# Patient Record
Sex: Male | Born: 1950 | Race: Black or African American | Hispanic: No | Smoking: Former smoker
Health system: Southern US, Community
[De-identification: ages and names within clinical notes are randomized; demographics above are authoritative.]

## PROBLEM LIST (undated history)

## (undated) DIAGNOSIS — J302 Other seasonal allergic rhinitis: Secondary | ICD-10-CM

## (undated) DIAGNOSIS — E785 Hyperlipidemia, unspecified: Secondary | ICD-10-CM

## (undated) DIAGNOSIS — F1910 Other psychoactive substance abuse, uncomplicated: Secondary | ICD-10-CM

## (undated) DIAGNOSIS — I10 Essential (primary) hypertension: Secondary | ICD-10-CM

## (undated) DIAGNOSIS — D649 Anemia, unspecified: Secondary | ICD-10-CM

## (undated) HISTORY — PX: CIRCUMCISION: SUR203

## (undated) HISTORY — PX: COLONOSCOPY: SHX174

## (undated) HISTORY — DX: Other seasonal allergic rhinitis: J30.2

## (undated) HISTORY — PX: POLYPECTOMY: SHX149

## (undated) HISTORY — PX: OTHER SURGICAL HISTORY: SHX169

## (undated) HISTORY — PX: WISDOM TOOTH EXTRACTION: SHX21

## (undated) HISTORY — DX: Essential (primary) hypertension: I10

## (undated) HISTORY — DX: Hyperlipidemia, unspecified: E78.5

## (undated) HISTORY — DX: Anemia, unspecified: D64.9

## (undated) HISTORY — DX: Other psychoactive substance abuse, uncomplicated: F19.10

---

## 2004-12-20 ENCOUNTER — Ambulatory Visit: Payer: Self-pay | Admitting: Internal Medicine

## 2015-06-15 ENCOUNTER — Encounter: Payer: Self-pay | Admitting: Family Medicine

## 2015-06-15 ENCOUNTER — Ambulatory Visit (INDEPENDENT_AMBULATORY_CARE_PROVIDER_SITE_OTHER): Payer: 59 | Admitting: Family Medicine

## 2015-06-15 ENCOUNTER — Encounter: Payer: Self-pay | Admitting: Internal Medicine

## 2015-06-15 VITALS — BP 140/100 | HR 83 | Temp 98.1°F | Ht 71.0 in | Wt 172.0 lb

## 2015-06-15 DIAGNOSIS — Z1211 Encounter for screening for malignant neoplasm of colon: Secondary | ICD-10-CM | POA: Diagnosis not present

## 2015-06-15 DIAGNOSIS — R030 Elevated blood-pressure reading, without diagnosis of hypertension: Secondary | ICD-10-CM

## 2015-06-15 DIAGNOSIS — Z87891 Personal history of nicotine dependence: Secondary | ICD-10-CM

## 2015-06-15 DIAGNOSIS — I517 Cardiomegaly: Secondary | ICD-10-CM

## 2015-06-15 DIAGNOSIS — E785 Hyperlipidemia, unspecified: Secondary | ICD-10-CM | POA: Insufficient documentation

## 2015-06-15 DIAGNOSIS — K4090 Unilateral inguinal hernia, without obstruction or gangrene, not specified as recurrent: Secondary | ICD-10-CM

## 2015-06-15 DIAGNOSIS — Z0000 Encounter for general adult medical examination without abnormal findings: Secondary | ICD-10-CM

## 2015-06-15 DIAGNOSIS — IMO0001 Reserved for inherently not codable concepts without codable children: Secondary | ICD-10-CM

## 2015-06-15 LAB — COMPREHENSIVE METABOLIC PANEL
ALT: 16 U/L (ref 0–53)
AST: 20 U/L (ref 0–37)
Albumin: 4 g/dL (ref 3.5–5.2)
Alkaline Phosphatase: 69 U/L (ref 39–117)
BUN: 12 mg/dL (ref 6–23)
CHLORIDE: 106 meq/L (ref 96–112)
CO2: 30 mEq/L (ref 19–32)
Calcium: 9.7 mg/dL (ref 8.4–10.5)
Creatinine, Ser: 0.89 mg/dL (ref 0.40–1.50)
GFR: 90.99 mL/min (ref >60.00)
GLUCOSE: 79 mg/dL (ref 70–99)
POTASSIUM: 4.6 meq/L (ref 3.5–5.1)
Sodium: 141 mEq/L (ref 135–145)
TOTAL PROTEIN: 6.9 g/dL (ref 6.0–8.3)
Total Bilirubin: 0.5 mg/dL (ref 0.2–1.2)

## 2015-06-15 LAB — LIPID PANEL
Cholesterol: 180 mg/dL (ref 0–200)
HDL: 37.3 mg/dL — AB (ref >39.00)
LDL CALC: 128 mg/dL — AB (ref 0–99)
NONHDL: 143.16
Total CHOL/HDL Ratio: 5
Triglycerides: 74 mg/dL (ref 0.0–149.0)
VLDL: 14.8 mg/dL (ref 0.0–40.0)

## 2015-06-15 LAB — POCT URINALYSIS DIPSTICK
BILIRUBIN UA: NEGATIVE
GLUCOSE UA: NEGATIVE
Ketones, UA: NEGATIVE
LEUKOCYTES UA: NEGATIVE
NITRITE UA: NEGATIVE
Protein, UA: NEGATIVE
RBC UA: NEGATIVE
Spec Grav, UA: 1.02
Urobilinogen, UA: 0.2
pH, UA: 6

## 2015-06-15 LAB — CBC
HEMATOCRIT: 38.8 % — AB (ref 39.0–52.0)
HEMOGLOBIN: 12.4 g/dL — AB (ref 13.0–17.0)
MCHC: 32 g/dL (ref 30.0–36.0)
MCV: 78.9 fl (ref 78.0–100.0)
Platelets: 306 10*3/uL (ref 150.0–400.0)
RBC: 4.92 Mil/uL (ref 4.22–5.81)
RDW: 14.2 % (ref 11.5–15.5)
WBC: 5.3 10*3/uL (ref 4.0–10.5)

## 2015-06-15 LAB — TSH: TSH: 0.68 u[IU]/mL (ref 0.35–4.50)

## 2015-06-15 NOTE — Patient Instructions (Addendum)
Make sure to get regular dental exams (every 6 months) and eye exams (every 1-2 years)  Your blood pressure concerns me. I would like for you to buy/use a home cuff to check at least daily. Your goal is <140/90. Since we are considering surgery for left hernia, we need to get this down. I would be ok seeing you at 8 am next Tuesday to check in to see how blood pressure is doing and if we need to consider medication. If you are thinking surgery at a later date, ok to see me in early January (unless BP regularly over 150/90). Bring your log and blood pressure cuff with you. Try Dash Eating plan listed below, cut soda, consider regular exercise even outside of work  We will call you within a week about your referral to GI for colonoscopy and surgery for left groin hernia. If you do not hear within 2 weeks, give [place] a call.   Your EKG is concerning for enlargement of left side of heart which could be blood pressure related. We are going to get an echocardiogram (ultrasound of heart) to assess further  You declined flu shot  Labs before you go.   DASH Eating Plan DASH stands for "Dietary Approaches to Stop Hypertension." The DASH eating plan is a healthy eating plan that has been shown to reduce high blood pressure (hypertension). Additional health benefits may include reducing the risk of type 2 diabetes mellitus, heart disease, and stroke. The DASH eating plan may also help with weight loss. WHAT DO I NEED TO KNOW ABOUT THE DASH EATING PLAN? For the DASH eating plan, you will follow these general guidelines:  Choose foods with a percent daily value for sodium of less than 5% (as listed on the food label).  Use salt-free seasonings or herbs instead of table salt or sea salt.  Check with your health care provider or pharmacist before using salt substitutes.  Eat lower-sodium products, often labeled as "lower sodium" or "no salt added."  Eat fresh foods.  Eat more vegetables, fruits, and low-fat  dairy products.  Choose whole grains. Look for the word "whole" as the first word in the ingredient list.  Choose fish and skinless chicken or [place] more often than red meat. Limit fish, poultry, and meat to 6 oz (170 g) each day.  Limit sweets, desserts, sugars, and sugary drinks.  Choose heart-healthy fats.  Limit cheese to 1 oz (28 g) per day.  Eat more home-cooked food and less restaurant, buffet, and fast food.  Limit fried foods.  Cook foods using methods other than frying.  Limit canned vegetables. If you do use them, rinse them well to decrease the sodium.  When eating at a restaurant, ask that your food be prepared with less salt, or no salt if possible. WHAT FOODS CAN I EAT? Seek help from a dietitian for individual calorie needs. Grains Whole grain or whole wheat bread. Brown rice. Whole grain or whole wheat pasta. Quinoa, bulgur, and whole grain cereals. Low-sodium cereals. Corn or whole wheat flour tortillas. Whole grain cornbread. Whole grain crackers. Low-sodium crackers. Vegetables Fresh or frozen vegetables (raw, steamed, roasted, or grilled). Low-sodium or reduced-sodium tomato and vegetable juices. Low-sodium or reduced-sodium tomato sauce and paste. Low-sodium or reduced-sodium canned vegetables.  Fruits All fresh, canned (in natural juice), or frozen fruits. Meat and Other Protein Products Ground beef (85% or leaner), grass-fed beef, or beef trimmed of fat. Skinless chicken or [place]. Ground chicken or [place]. Pork trimmed of  fat. All fish and seafood. Eggs. Dried beans, peas, or lentils. Unsalted nuts and seeds. Unsalted canned beans. Dairy Low-fat dairy products, such as skim or 1% milk, 2% or reduced-fat cheeses, low-fat ricotta or cottage cheese, or plain low-fat yogurt. Low-sodium or reduced-sodium cheeses. Fats and Oils Tub margarines without trans fats. Light or reduced-fat mayonnaise and salad dressings (reduced sodium). Avocado. Safflower, olive, or  canola oils. Natural peanut or almond butter. Other Unsalted popcorn and pretzels. The items listed above may not be a complete list of recommended foods or beverages. Contact your dietitian for more options. WHAT FOODS ARE NOT RECOMMENDED? Grains White bread. White pasta. White rice. Refined cornbread. Bagels and croissants. Crackers that contain trans fat. Vegetables Creamed or fried vegetables. Vegetables in a cheese sauce. Regular canned vegetables. Regular canned tomato sauce and paste. Regular tomato and vegetable juices. Fruits Dried fruits. Canned fruit in light or heavy syrup. Fruit juice. Meat and Other Protein Products Fatty cuts of meat. Ribs, chicken wings, bacon, sausage, bologna, salami, chitterlings, fatback, hot dogs, bratwurst, and packaged luncheon meats. Salted nuts and seeds. Canned beans with salt. Dairy Whole or 2% milk, cream, half-and-half, and cream cheese. Whole-fat or sweetened yogurt. Full-fat cheeses or blue cheese. Nondairy creamers and whipped toppings. Processed cheese, cheese spreads, or cheese curds. Condiments Onion and garlic salt, seasoned salt, table salt, and sea salt. Canned and packaged gravies. Worcestershire sauce. Tartar sauce. Barbecue sauce. Teriyaki sauce. Soy sauce, including reduced sodium. Steak sauce. Fish sauce. Oyster sauce. Cocktail sauce. Horseradish. Ketchup and mustard. Meat flavorings and tenderizers. Bouillon cubes. Hot sauce. Tabasco sauce. Marinades. Taco seasonings. Relishes. Fats and Oils Butter, stick margarine, lard, shortening, ghee, and bacon fat. Coconut, palm kernel, or palm oils. Regular salad dressings. Other Pickles and olives. Salted popcorn and pretzels. The items listed above may not be a complete list of foods and beverages to avoid. Contact your dietitian for more information. WHERE CAN I FIND MORE INFORMATION? National Heart, Lung, and Blood Institute: travelstabloid.com   This  information is not intended to replace advice given to you by your health care provider. Make sure you discuss any questions you have with your health care provider.   Document Released: 06/09/2011 Document Revised: 07/11/2014 Document Reviewed: 04/24/2013 Elsevier Interactive Patient Education [employer].
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
## 2015-06-15 NOTE — Addendum Note (Signed)
Addended by: [name] on: 06/15/2015 09:09 AM   Modules accepted: Orders

## 2015-06-22 ENCOUNTER — Encounter: Payer: Self-pay | Admitting: Family Medicine

## 2015-06-22 ENCOUNTER — Ambulatory Visit (INDEPENDENT_AMBULATORY_CARE_PROVIDER_SITE_OTHER): Payer: 59 | Admitting: Family Medicine

## 2015-06-22 VITALS — BP 160/80 | HR 82 | Temp 98.4°F | Wt 175.0 lb

## 2015-06-22 DIAGNOSIS — E785 Hyperlipidemia, unspecified: Secondary | ICD-10-CM

## 2015-06-22 DIAGNOSIS — I10 Essential (primary) hypertension: Secondary | ICD-10-CM | POA: Insufficient documentation

## 2015-06-22 MED ORDER — HYDROCHLOROTHIAZIDE 25 MG PO TABS: 25.0000 mg | ORAL_TABLET | Freq: Every day | ORAL | Status: DC

## 2015-06-22 NOTE — Assessment & Plan Note (Signed)
S: poorly controlled on no medicine.  Lab Results  Component Value Date   CHOL 180 06/15/2015   HDL 37.30* 06/15/2015   LDLCALC 128* 06/15/2015   TRIG 74.0 06/15/2015   CHOLHDL 5 06/15/2015   A/P: no rx. Calculate risk once BP controlled. Consider statin. Wife may be resistant to idea- perhaps perioperative statin?

## 2015-06-22 NOTE — Patient Instructions (Addendum)
Start Hydrochlorothiazide 25mg   We will follow up at next visit  I think we have time before colonoscopy and surgery to get blood pressure in a better place- so do not need to push back either at present  Health Maintenance Due  Topic Date Due  . Hepatitis C Screening  09/05/1950  . HIV Screening  10/17/1964  . TETANUS/TDAP  10/17/1968  . COLONOSCOPY  10/18/1999  . ZOSTAVAX  10/17/2009  . PNA vac Low Risk Adult (1 of 2 - PCV13) 10/18/2014

## 2015-06-22 NOTE — Progress Notes (Signed)
David Reddish, MD  Subjective:  David Gilmore is a 64 y.o. year old very pleasant male patient who presents for/with See problem oriented charting ROS- No chest pain or shortness of breath. No headache or blurry vision. Able to complete 4 mets without difficulty (stairs or walk up incline)  Past Medical History-  Patient Active Problem List   Diagnosis Date Noted  . Essential hypertension 06/22/2015    Priority: Medium  . Hyperlipidemia     Priority: Medium  . Former smoker 06/15/2015    Priority: Low    Medications- reviewed and updated Current Outpatient Prescriptions  Medication Sig Dispense Refill  . hydrochlorothiazide (HYDRODIURIL) 25 MG tablet Take 1 tablet (25 mg total) by mouth daily. 90 tablet 3   No current facility-administered medications for this visit.    Objective: BP 160/80 mmHg  Pulse 82  Temp(Src) 98.4 F (36.9 C)  Wt 175 lb (79.379 kg) Gen: NAD, resting comfortably CV: RRR no murmurs rubs or gallops Lungs: CTAB no crackles, wheeze, rhonchi Abdomen: soft/nontender/nondistended/normal bowel sounds. No rebound or guarding.  Ext: no edema Skin: warm, dry Neuro: grossly normal, moves all extremities  Assessment/Plan:  Essential hypertension S: controlled poorly without any medication. My repeat was 156/06. New diagnosis given 2 consecutive elevated SBP readings  BP Readings from Last 3 Encounters:  06/22/15 160/80  06/15/15 140/100  A/P: Start HCTZ. Follow up early January as scheduled.    Hyperlipidemia S: poorly controlled on no medicine.  Lab Results  Component Value Date   CHOL 180 06/15/2015   HDL 37.30* 06/15/2015   LDLCALC 128* 06/15/2015   TRIG 74.0 06/15/2015   CHOLHDL 5 06/15/2015   A/P: no rx. Calculate risk once BP controlled. Consider statin. Wife may be resistant to idea- perhaps perioperative statin?    January follow up return precautions advised.   Meds ordered this encounter  Medications  . hydrochlorothiazide  (HYDRODIURIL) 25 MG tablet    Sig: Take 1 tablet (25 mg total) by mouth daily.    Dispense:  90 tablet    Refill:  3

## 2015-06-22 NOTE — Assessment & Plan Note (Signed)
S: controlled poorly without any medication. My repeat was 156/06. New diagnosis given 2 consecutive elevated SBP readings  BP Readings from Last 3 Encounters:  06/22/15 160/80  06/15/15 140/100  A/P: Start HCTZ. Follow up early January as scheduled.

## 2015-06-23 ENCOUNTER — Ambulatory Visit (HOSPITAL_COMMUNITY): Payer: 59 | Attending: Family Medicine

## 2015-06-23 ENCOUNTER — Other Ambulatory Visit: Payer: Self-pay

## 2015-06-23 ENCOUNTER — Telehealth: Payer: Self-pay | Admitting: Family Medicine

## 2015-06-23 DIAGNOSIS — R931 Abnormal findings on diagnostic imaging of heart and coronary circulation: Secondary | ICD-10-CM

## 2015-06-23 DIAGNOSIS — I517 Cardiomegaly: Secondary | ICD-10-CM | POA: Insufficient documentation

## 2015-06-23 DIAGNOSIS — I10 Essential (primary) hypertension: Secondary | ICD-10-CM | POA: Insufficient documentation

## 2015-06-23 DIAGNOSIS — I5189 Other ill-defined heart diseases: Secondary | ICD-10-CM | POA: Diagnosis not present

## 2015-06-23 DIAGNOSIS — I071 Rheumatic tricuspid insufficiency: Secondary | ICD-10-CM | POA: Diagnosis not present

## 2015-06-23 DIAGNOSIS — Z87891 Personal history of nicotine dependence: Secondary | ICD-10-CM | POA: Diagnosis not present

## 2015-06-23 NOTE — Telephone Encounter (Signed)
Due to echo results, ordered stress test to be done before potential surgery and colonoscopy. See echo result note. Discussed with patient

## 2015-06-26 ENCOUNTER — Other Ambulatory Visit (HOSPITAL_COMMUNITY): Payer: 59

## 2015-06-26 ENCOUNTER — Ambulatory Visit (HOSPITAL_COMMUNITY)
Admission: RE | Admit: 2015-06-26 | Discharge: 2015-06-26 | Disposition: A | Discharge status: Home / Self Care | Payer: 59 | Source: Ambulatory Visit | Attending: Family Medicine | Admitting: Family Medicine

## 2015-06-26 DIAGNOSIS — I700 Atherosclerosis of aorta: Secondary | ICD-10-CM | POA: Insufficient documentation

## 2015-06-26 DIAGNOSIS — Z87891 Personal history of nicotine dependence: Secondary | ICD-10-CM | POA: Insufficient documentation

## 2015-06-26 DIAGNOSIS — E785 Hyperlipidemia, unspecified: Secondary | ICD-10-CM | POA: Insufficient documentation

## 2015-06-26 DIAGNOSIS — I10 Essential (primary) hypertension: Secondary | ICD-10-CM | POA: Diagnosis not present

## 2015-07-01 ENCOUNTER — Telehealth (HOSPITAL_COMMUNITY): Payer: Self-pay | Admitting: *Deleted

## 2015-07-01 ENCOUNTER — Encounter (HOSPITAL_COMMUNITY): Payer: 59

## 2015-07-01 NOTE — Telephone Encounter (Signed)
Left message on voicemail in reference to upcoming appointment scheduled for 07/07/15. Phone number given for a call back so details instructions can be given. Kaavya Puskarich, Ranae [place]

## 2015-07-07 ENCOUNTER — Encounter (HOSPITAL_COMMUNITY): Payer: 59

## 2015-07-13 ENCOUNTER — Ambulatory Visit: Payer: 59 | Admitting: Family Medicine

## 2015-07-16 ENCOUNTER — Telehealth (HOSPITAL_COMMUNITY): Payer: Self-pay

## 2015-07-16 ENCOUNTER — Telehealth (HOSPITAL_COMMUNITY): Payer: Self-pay | Admitting: *Deleted

## 2015-07-16 NOTE — Telephone Encounter (Signed)
Encounter complete. 

## 2015-07-16 NOTE — Telephone Encounter (Signed)
Patient given detailed instructions per Myocardial Perfusion Study Information Sheet for the test on 07/20/15 at 730. Patient notified to arrive 15 minutes early and that it is imperative to arrive on time for appointment to keep from having the test rescheduled.  If you need to cancel or reschedule your appointment, please call the office within 24 hours of your appointment. Failure to do so may result in a cancellation of your appointment, and a $50 no show fee. Patient verbalized understanding.Hubbard Robinson, RN

## 2015-07-20 ENCOUNTER — Ambulatory Visit (HOSPITAL_COMMUNITY): Payer: 59 | Attending: Cardiovascular Disease

## 2015-07-20 DIAGNOSIS — I10 Essential (primary) hypertension: Secondary | ICD-10-CM | POA: Insufficient documentation

## 2015-07-20 DIAGNOSIS — R42 Dizziness and giddiness: Secondary | ICD-10-CM | POA: Insufficient documentation

## 2015-07-20 DIAGNOSIS — R931 Abnormal findings on diagnostic imaging of heart and coronary circulation: Secondary | ICD-10-CM | POA: Diagnosis present

## 2015-07-20 LAB — MYOCARDIAL PERFUSION IMAGING
CHL CUP MPHR: 155 {beats}/min
CHL CUP RESTING HR STRESS: 82 {beats}/min
CHL RATE OF PERCEIVED EXERTION: 15
CSEPEDS: 5 s
CSEPEW: 8.5 METS
CSEPHR: 103 %
CSEPPHR: 160 {beats}/min
Exercise duration (min): 10 min
LHR: 0.27
LVDIAVOL: 135 mL
LVSYSVOL: 87 mL
SDS: 0
SRS: 4
SSS: 4
TID: 1.01

## 2015-07-20 MED ORDER — TECHNETIUM TC 99M SESTAMIBI GENERIC - CARDIOLITE
32.3000 | Freq: Once | INTRAVENOUS | Status: AC | PRN
  Administered 2015-07-20: 32.3 via INTRAVENOUS

## 2015-07-20 MED ORDER — TECHNETIUM TC 99M SESTAMIBI GENERIC - CARDIOLITE
10.7000 | Freq: Once | INTRAVENOUS | Status: AC | PRN
  Administered 2015-07-20: 11 via INTRAVENOUS

## 2015-07-27 ENCOUNTER — Ambulatory Visit (INDEPENDENT_AMBULATORY_CARE_PROVIDER_SITE_OTHER): Payer: 59 | Admitting: Family Medicine

## 2015-07-27 ENCOUNTER — Encounter: Payer: Self-pay | Admitting: Family Medicine

## 2015-07-27 ENCOUNTER — Telehealth: Payer: Self-pay | Admitting: Family Medicine

## 2015-07-27 VITALS — BP 132/84 | HR 93 | Temp 97.8°F | Wt 175.0 lb

## 2015-07-27 DIAGNOSIS — I10 Essential (primary) hypertension: Secondary | ICD-10-CM

## 2015-07-27 DIAGNOSIS — R9439 Abnormal result of other cardiovascular function study: Secondary | ICD-10-CM

## 2015-07-27 DIAGNOSIS — Z23 Encounter for immunization: Secondary | ICD-10-CM

## 2015-07-27 DIAGNOSIS — E785 Hyperlipidemia, unspecified: Secondary | ICD-10-CM | POA: Diagnosis not present

## 2015-07-27 NOTE — Assessment & Plan Note (Signed)
S: controlled after start of hctz 25mg  last visit  BP Readings from Last 3 Encounters:  07/27/15 132/84  06/22/15 160/80  06/15/15 140/100  A/P:Continue current medications treat to <140/90.

## 2015-07-27 NOTE — Assessment & Plan Note (Addendum)
S: Patient has been out of medical care for years. When I first evaluated him, blood pressure was 140/100 and a week later was 160/80. Initial EKG showed concern for ventricular hypertrophy. He likely had dealth with long term hypertension. EKG was done before patient proceeded for first colonoscopy or for hernia surgery. Due to LVH, completed an echocardiogram which showed  EF 45-50% and diffuse hypokinesis. Due to mildly depressed EF and diffuse hypokinesis, a myocardial perfusion scan was ordered and is noted in objective section with highlights being EF of 35% as well as some upsloping ST segment deperssions which quickly returned to baseline after minute of recovery and overall study report was "no evidence of ischemia".  A/P: I discussed with family today that with reduced EF on echo and myocardial perfusion stress test as well as some EKG changes during stress test, I would like to get cardiology's opinion if he can proceed forward with hernia surgery and colonoscopy. My primary question is given high risk patient (65 year old male with hypertension and untreated hyperlipidemia-not ready to start statin yet- as well as 30 pack year smoker who quit in 2000) should he have cardiac cath, other further studies, or can he safely proceed to surgery and colonoscopy.

## 2015-07-27 NOTE — Assessment & Plan Note (Addendum)
S: poorly controlled on no medicine. No myalgias.  Lab Results  Component Value Date   CHOL 180 06/15/2015   HDL 37.30* 06/15/2015   LDLCALC 128* 06/15/2015   TRIG 74.0 06/15/2015   CHOLHDL 5 06/15/2015   A/P: I advised statin with 18% 10 year risk of cva/mi. I would especially advise perioperatively for upcoming hernia surgery or colonoscopy. Patient declines at present- wants to discuss with cardiology. Did advise at a minimum aspirin 81mg .

## 2015-07-27 NOTE — Progress Notes (Signed)
David Reddish, MD  Subjective:  David Gilmore is a 65 y.o. year old very pleasant male patient who presents for/with See problem oriented charting ROS- No chest pain or shortness of breath including with activity. No headache or blurry vision.   Past Medical History-  Patient Active Problem List   Diagnosis Date Noted  . Essential hypertension 06/22/2015    Priority: Medium  . Hyperlipidemia     Priority: Medium  . Former smoker 06/15/2015    Priority: Low  . Abnormal stress test 07/27/2015    Medications- reviewed and updated Current Outpatient Prescriptions  Medication Sig Dispense Refill  . aspirin 81 MG tablet Take 81 mg by mouth daily.    . hydrochlorothiazide (HYDRODIURIL) 25 MG tablet Take 1 tablet (25 mg total) by mouth daily. 90 tablet 3   No current facility-administered medications for this visit.    Objective: BP 132/84 mmHg  Pulse 93  Temp(Src) 97.8 F (36.6 C)  Wt 175 lb (79.379 kg) Gen: NAD, resting comfortably CV: RRR no murmurs rubs or gallops Lungs: CTAB no crackles, wheeze, rhonchi Abdomen: soft/nontender/nondistended/normal bowel sounds. No rebound or guarding.  Ext: no edema Skin: warm, dry Neuro: grossly normal, moves all extremities  07/20/15 Study Highlights     Nuclear stress EF: 35%.  Blood pressure demonstrated a hypertensive response to exercise.  Upsloping ST segment depression ST segment depression of 3 mm was noted during stress in the II, III, aVF, V4, V5 and V6 leads, and returning to baseline after less than 1 minute of recovery.  The left ventricular ejection fraction is moderately decreased (30-44%).  The LV perfusion is good. No evidence of ischemia. Exercise tolerance is good. 10 minutes on Bruce Protocol. No chest pain. The LV systolic function is depressed at 35% with global hypokinesis. Consider echo to evaluate LV systolic function further.   Assessment/Plan:  Abnormal stress test S: Patient has been out  of medical care for years. When I first evaluated him, blood pressure was 140/100 and a week later was 160/80. Initial EKG showed concern for ventricular hypertrophy. He likely had dealth with long term hypertension. EKG was done before patient proceeded for first colonoscopy or for hernia surgery. Due to LVH, completed an echocardiogram which showed  EF 45-50% and diffuse hypokinesis. Due to mildly depressed EF and diffuse hypokinesis, a myocardial perfusion scan was ordered and is noted in objective section with highlights being EF of 35% as well as some upsloping ST segment deperssions which quickly returned to baseline after minute of recovery and overall study report was "no evidence of ischemia".  A/P: I discussed with family today that with reduced EF on echo and myocardial perfusion stress test as well as some EKG changes during stress test, I would like to get cardiology's opinion if he can proceed forward with hernia surgery and colonoscopy. My primary question is given high risk patient (65 year old male with hypertension and untreated hyperlipidemia-not ready to start statin yet- as well as 30 pack year smoker who quit in 2000) should he have cardiac cath, other further studies, or can he safely proceed to surgery and colonoscopy.    Essential hypertension S: controlled after start of hctz 25mg  last visit  BP Readings from Last 3 Encounters:  07/27/15 132/84  06/22/15 160/80  06/15/15 140/100  A/P:Continue current medications treat to <140/90.    Hyperlipidemia S: poorly controlled on no medicine. No myalgias.  Lab Results  Component Value Date   CHOL 180 06/15/2015  HDL 37.30* 06/15/2015   LDLCALC 128* 06/15/2015   TRIG 74.0 06/15/2015   CHOLHDL 5 06/15/2015   A/P: I advised statin with 18% 10 year risk of cva/mi. I would especially advise perioperatively for upcoming hernia surgery or colonoscopy. Patient declines at present- wants to discuss with cardiology. Did advise at a  minimum aspirin 81mg .      3 months verbal with sooner Return precautions advised.   Orders Placed This Encounter  Procedures  . Pneumococcal conjugate vaccine 13-valent  . Ambulatory referral to Cardiology    Referral Priority:  Routine    Referral Type:  Consultation    Referral Reason:  Specialty Services Required    Requested Specialty:  Cardiology    Number of Visits Requested:  1    Meds ordered this encounter  Medications  . aspirin 81 MG tablet    Sig: Take 81 mg by mouth daily.

## 2015-07-27 NOTE — Telephone Encounter (Signed)
Dr hunter Dr Einar Gip  Office stated in order for this pt to be seen they are requiring office notes to be faxed  for review . Once Dr Einar Gip review then he will make a determation if he can see this pt or not  I faxed over notes and I informed pt of this as well please advise

## 2015-07-27 NOTE — Telephone Encounter (Signed)
Can you please make sure they get my note from today? I just finished it. My primary question is with depressed EF in high risk patient and in light of some of the abnormal findings on stress test- does patient need cardiac cath before hernia surgery or colonoscopy?

## 2015-07-27 NOTE — Patient Instructions (Addendum)
May have to cancel colonoscopy   We will call you within a week about your referral to Dr. Einar Gip. If you do not hear within 2 weeks, give [place] a call.   Blood pressure looks excellent today and I am very pleased that is just with one medicine.   Start aspirin 81mg  daily (baby aspirin)  You wanted to get Dr. Irven Shelling opinion on cholesterol medicine. Your risk is nearly 18% 10 year for heart attack or stroke which is above 10% cut off I use to advise medicine. i would advise something like atorvastatin 20mg  daily or rosuvastatin 10mg  etc.

## 2015-08-14 ENCOUNTER — Encounter: Payer: Self-pay | Admitting: Internal Medicine

## 2015-08-25 ENCOUNTER — Encounter: Payer: 59 | Admitting: Internal Medicine

## 2015-09-02 HISTORY — PX: HERNIA REPAIR: SHX51

## 2015-09-25 ENCOUNTER — Telehealth: Payer: Self-pay

## 2015-09-25 NOTE — Telephone Encounter (Signed)
Pt has an EF of 35%. Advd pt will need to cancel appts and rsc at [facility]. Expln will cb with new appt date and time. Pt okay w plan.

## 2015-09-28 ENCOUNTER — Telehealth: Payer: Self-pay | Admitting: *Deleted

## 2015-09-28 NOTE — Telephone Encounter (Signed)
Patient had stress test 07/20/15, EF 35%. See TE from [place] on 07/27/15, due to EF 35% and abnormal findings, his question was does pt need cardiac cath before hernia sx and colonoscopy? Do you want OV first or direct hospital colonoscopy? Please advise. Thanks, David Gilmore PV

## 2015-09-28 NOTE — Telephone Encounter (Signed)
I would wait to do any colonoscopy until cardiac eval finished   I amm ccing Dr. Einar Gip - I think he will be seeing this patient or maybe has.

## 2015-09-28 NOTE — Progress Notes (Signed)
Gatha Mayer, MD  Kasandra Knudsen, RN            Direct at hospital ok and I have some openings on 4/4 if he is interested otherwise later in April       Previous Messages     ----- Message -----   From: Kasandra Knudsen, RN   Sent: 09/25/2015  2:37 PM    To: Marlon Pel, RN, Gatha Mayer, MD  Subject: Pt has EF 35%                   Pt has an EF of 35%. Do you want pt to be a direct colonoscopy at [facility] or an OV prior to procedure? Thanks, Wendy-PV

## 2015-09-28 NOTE — Telephone Encounter (Signed)
Called patient. No answer. Left message of Dr.Gessner's recommendations on voice mail. Told pt to call us back if he has any questions.

## 2015-10-12 ENCOUNTER — Encounter: Payer: 59 | Admitting: Internal Medicine

## 2015-10-29 ENCOUNTER — Encounter: Payer: Self-pay | Admitting: Family Medicine

## 2015-10-29 ENCOUNTER — Ambulatory Visit (INDEPENDENT_AMBULATORY_CARE_PROVIDER_SITE_OTHER): Payer: 59 | Admitting: Family Medicine

## 2015-10-29 ENCOUNTER — Telehealth: Payer: Self-pay | Admitting: Internal Medicine

## 2015-10-29 ENCOUNTER — Other Ambulatory Visit: Payer: Self-pay | Admitting: Family Medicine

## 2015-10-29 VITALS — BP 110/72 | HR 80 | Temp 98.2°F | Wt 176.0 lb

## 2015-10-29 DIAGNOSIS — Z1211 Encounter for screening for malignant neoplasm of colon: Secondary | ICD-10-CM

## 2015-10-29 DIAGNOSIS — E785 Hyperlipidemia, unspecified: Secondary | ICD-10-CM | POA: Diagnosis not present

## 2015-10-29 DIAGNOSIS — I10 Essential (primary) hypertension: Secondary | ICD-10-CM | POA: Diagnosis not present

## 2015-10-29 DIAGNOSIS — Z7289 Other problems related to lifestyle: Secondary | ICD-10-CM | POA: Diagnosis not present

## 2015-10-29 LAB — CBC
HCT: 34.1 % — ABNORMAL LOW (ref 39.0–52.0)
Hemoglobin: 11.1 g/dL — ABNORMAL LOW (ref 13.0–17.0)
MCHC: 32.6 g/dL (ref 30.0–36.0)
MCV: 79.1 fl (ref 78.0–100.0)
Platelets: 281 10*3/uL (ref 150.0–400.0)
RBC: 4.31 Mil/uL (ref 4.22–5.81)
RDW: 14.6 % (ref 11.5–15.5)
WBC: 5.9 10*3/uL (ref 4.0–10.5)

## 2015-10-29 LAB — BASIC METABOLIC PANEL
BUN: 12 mg/dL (ref 6–23)
CHLORIDE: 102 meq/L (ref 96–112)
CO2: 33 mEq/L — ABNORMAL HIGH (ref 19–32)
Calcium: 9.9 mg/dL (ref 8.4–10.5)
Creatinine, Ser: 0.94 mg/dL (ref 0.40–1.50)
GFR: 103.25 mL/min (ref >60.00)
Glucose, Bld: 73 mg/dL (ref 70–99)
POTASSIUM: 4 meq/L (ref 3.5–5.1)
Sodium: 140 mEq/L (ref 135–145)

## 2015-10-29 LAB — LDL CHOLESTEROL, DIRECT: LDL DIRECT: 60 mg/dL

## 2015-10-29 NOTE — Patient Instructions (Addendum)
No changes to medications needed  Bloodwork before you leave  Refer back for colonoscopy. They will call you.    Health Maintenance Due  Topic Date Due  . Hepatitis C Screening - today 01/21/1951  . TETANUS/TDAP - if you get a cut you need to get this 10/17/1968

## 2015-10-29 NOTE — Assessment & Plan Note (Signed)
S: controlled on Lisinopril 10mg -hctz 12.5mg , metoprolol 25 mg XL BP Readings from Last 3 Encounters:  10/29/15 110/72  07/27/15 132/84  06/22/15 160/80  A/P:Continue current meds:  No change. Also follows with cardiology

## 2015-10-29 NOTE — Assessment & Plan Note (Signed)
S: expect improved controlled on crestor 20mg .. No myalgias. 18% 10 year risk before starting  Lab Results  Component Value Date   CHOL 180 06/15/2015   HDL 37.30* 06/15/2015   LDLCALC 128* 06/15/2015   TRIG 74.0 06/15/2015   CHOLHDL 5 06/15/2015   A/P: update direct LDL today- hopeful LDL <100 or even under 70

## 2015-10-29 NOTE — Progress Notes (Signed)
Subjective:  David Gilmore is a 65 y.o. year old very pleasant male patient who presents for/with See problem oriented charting ROS- No chest pain or shortness of breath. No headache or blurry vision.   Past Medical History-  Patient Active Problem List   Diagnosis Date Noted  . Essential hypertension 06/22/2015    Priority: Medium  . Hyperlipidemia     Priority: Medium  . Abnormal stress test 07/27/2015    Priority: Low  . Former smoker 06/15/2015    Priority: Low    Medications- reviewed and updated Current Outpatient Prescriptions  Medication Sig Dispense Refill  . aspirin 81 MG tablet Take 81 mg by mouth daily.    Marland Kitchen lisinopril-hydrochlorothiazide (PRINZIDE,ZESTORETIC) 10-12.5 MG tablet Take 1 tablet by mouth daily.    . metoprolol succinate (TOPROL-XL) 25 MG 24 hr tablet Take 25 mg by mouth daily.    . Multiple Vitamins-Minerals (CENTRUM SILVER ADULT 50+) TABS Take by mouth.    . rosuvastatin (CRESTOR) 20 MG tablet Take 20 mg by mouth daily.     No current facility-administered medications for this visit.    Objective: BP 110/72 mmHg  Pulse 80  Temp(Src) 98.2 F (36.8 C)  Wt 176 lb (79.833 kg) Gen: NAD, resting comfortably CV: RRR no murmurs rubs or gallops Lungs: CTAB no crackles, wheeze, rhonchi Abdomen: soft/nontender/nondistended/normal bowel sounds. No rebound or guarding.  Ext: no edema Skin: warm, dry Neuro: grossly normal, moves all extremities  Assessment/Plan:  Essential hypertension S: controlled on Lisinopril 10mg -hctz 12.5mg , metoprolol 25 mg XL BP Readings from Last 3 Encounters:  10/29/15 110/72  07/27/15 132/84  06/22/15 160/80  A/P:Continue current meds:  No change. Also follows with cardiology  Hyperlipidemia S: expect improved controlled on crestor 20mg .. No myalgias. 18% 10 year risk before starting  Lab Results  Component Value Date   CHOL 180 06/15/2015   HDL 37.30* 06/15/2015   LDLCALC 128* 06/15/2015   TRIG 74.0 06/15/2015    CHOLHDL 5 06/15/2015   A/P: update direct LDL today- hopeful LDL <100 or even under 70   Did nto discuss follow up. 6 months reasonable.  Return precautions advised.   Refer back to GI for colonoscopy- has been cleared for low risk procedures by Dr. Einar Gip. Check cbc to make sure anemia not worsening.  Orders Placed This Encounter  Procedures  . CBC    [place]  . Basic metabolic panel    [facility]  . LDL cholesterol, direct      . Hepatitis C antibody, reflex    [facility]  . Ambulatory referral to Gastroenterology    Referral Priority:  Urgent    Referral Type:  Consultation    Referral Reason:  Specialty Services Required    Number of Visits Requested:  1    Meds ordered this encounter  Medications  . Multiple Vitamins-Minerals (CENTRUM SILVER ADULT 50+) TABS    Sig: Take by mouth.  Marland Kitchen lisinopril-hydrochlorothiazide (PRINZIDE,ZESTORETIC) 10-12.5 MG tablet    Sig: Take 1 tablet by mouth daily.  . metoprolol succinate (TOPROL-XL) 25 MG 24 hr tablet    Sig: Take 25 mg by mouth daily.  . rosuvastatin (CRESTOR) 20 MG tablet    Sig: Take 20 mg by mouth daily.    [name], MD

## 2015-10-29 NOTE — Telephone Encounter (Signed)
Patient will need office visit 1st.  Do not see any notes from Cardiology. EF is 35 % and will need done at the hospital.

## 2015-10-29 NOTE — Telephone Encounter (Signed)
Please see phone notes from 3/27 and advise.  There is a new referral from PCP to have pt scheduled for colonoscopy

## 2015-10-30 ENCOUNTER — Encounter: Payer: Self-pay | Admitting: Gastroenterology

## 2015-10-30 LAB — HEPATITIS C ANTIBODY: HCV AB: NEGATIVE

## 2015-11-12 ENCOUNTER — Ambulatory Visit (AMBULATORY_SURGERY_CENTER): Payer: Self-pay

## 2015-11-12 VITALS — Ht 71.0 in | Wt 176.4 lb

## 2015-11-12 DIAGNOSIS — Z1211 Encounter for screening for malignant neoplasm of colon: Secondary | ICD-10-CM

## 2015-11-12 NOTE — Progress Notes (Signed)
No allergies to eggs or soy No past problems with anesthesia No home oxygen No diet meds  No internet 

## 2015-11-25 ENCOUNTER — Encounter: Payer: Self-pay | Admitting: Gastroenterology

## 2015-11-25 ENCOUNTER — Ambulatory Visit (AMBULATORY_SURGERY_CENTER): Payer: 59 | Admitting: Gastroenterology

## 2015-11-25 VITALS — BP 133/68 | HR 71 | Temp 97.5°F | Resp 17 | Ht 71.0 in | Wt 176.0 lb

## 2015-11-25 DIAGNOSIS — D123 Benign neoplasm of transverse colon: Secondary | ICD-10-CM

## 2015-11-25 DIAGNOSIS — Z1211 Encounter for screening for malignant neoplasm of colon: Secondary | ICD-10-CM

## 2015-11-25 DIAGNOSIS — D124 Benign neoplasm of descending colon: Secondary | ICD-10-CM

## 2015-11-25 MED ORDER — SODIUM CHLORIDE 0.9 % IV SOLN: 500.0000 mL | INTRAVENOUS | Status: DC

## 2015-11-25 NOTE — Op Note (Signed)
[place] Patient Name: David Gilmore Procedure Date: 11/25/2015 2:37 PM MRN: [id number] Endoscopist: Mallie Mussel L. Loletha Carrow , MD Age: 65 Referring MD:  Date of Birth: 12/27/1950 Gender: Male Procedure:                Colonoscopy Indications:              Screening for colorectal malignant neoplasm, This                            is the patient's first colonoscopy Medicines:                Monitored Anesthesia Care Procedure:                Pre-Anesthesia Assessment:                           - Prior to the procedure, a History and Physical                            was performed, and patient medications and                            allergies were reviewed. The patient's tolerance of                            previous anesthesia was also reviewed. The risks                            and benefits of the procedure and the sedation                            options and risks were discussed with the patient.                            All questions were answered, and informed consent                            was obtained. Prior Anticoagulants: The patient has                            taken no previous anticoagulant or antiplatelet                            agents. ASA Grade Assessment: III - A patient with                            severe systemic disease. After reviewing the risks                            and benefits, the patient was deemed in                            satisfactory condition to undergo the procedure.  After obtaining informed consent, the colonoscope                            was passed under direct vision. Throughout the                            procedure, the patient's blood pressure, pulse, and                            oxygen saturations were monitored continuously. The                            Model CF-HQ190L [id number]) scope was introduced                            through the anus and advanced to the the  terminal                            ileum. The colonoscopy was performed without                            difficulty. The patient tolerated the procedure                            well. The quality of the bowel preparation was                            good. The terminal ileum, ileocecal valve,                            appendiceal orifice, and rectum were photographed.                            The bowel preparation used was Miralax. Scope In: 2:50:31 PM Scope Out: 3:08:46 PM Scope Withdrawal Time: 0 hours 12 minutes 49 seconds  Total Procedure Duration: 0 hours 18 minutes 15 seconds  Findings:                 The perianal and digital rectal examinations were                            normal.                           Two sessile polyps were found in the proximal                            descending colon and distal transverse colon. The                            polyps were 4 to 6 mm in size. These polyps were                            removed with a cold snare. Resection and retrieval  were complete. The DC polypectomy site oozed blood,                            so the tipe of cutery snare was used for hemostasis.                           The exam was otherwise without abnormality on                            direct and retroflexion views. Complications:            No immediate complications. Estimated Blood Loss:     Estimated blood loss was minimal. Impression:               - Two 4 to 6 mm polyps in the proximal descending                            colon and in the distal transverse colon, removed                            with a cold snare. Resected and retrieved.                           - The examination was otherwise normal on direct                            and retroflexion views. Recommendation:           - Patient has a contact number available for                            emergencies. The signs and symptoms of potential                             delayed complications were discussed with the                            patient. Return to normal activities tomorrow.                            Written discharge instructions were provided to the                            patient.                           - Resume previous diet.                           - No aspirin, ibuprofen, naproxen, or other                            non-steroidal anti-inflammatory drugs for 5 days  after polyp removal.                           - Await pathology results.                           - Repeat colonoscopy is recommended for                            surveillance. The colonoscopy date will be                            determined after pathology results from today's                            exam become available for review. Orene Abbasi L. Loletha Carrow, MD 11/25/2015 3:15:50 PM This report has been signed electronically.

## 2015-11-25 NOTE — Patient Instructions (Addendum)
Colon polyps x 2 removed today. Handout given on polyps. NO aspirin, ibuprofen, naproxen, aleve, or any other nonsteroidal anti-inflammatory medications for 5 days. (resume on Tuesday May 30th) Result letter in your mail in 2-3 weeks.  Call us with any questions or concerns. Thank you!   YOU HAD AN ENDOSCOPIC PROCEDURE TODAY AT [facility]:   Refer to the procedure report that was given to you for any specific questions about what was found during the examination.  If the procedure report does not answer your questions, please call your gastroenterologist to clarify.  If you requested that your care partner not be given the details of your procedure findings, then the procedure report has been included in a sealed envelope for you to review at your convenience later.  YOU SHOULD EXPECT: Some feelings of bloating in the abdomen. Passage of more gas than usual.  Walking can help get rid of the air that was put into your GI tract during the procedure and reduce the bloating. If you had a lower endoscopy (such as a colonoscopy or flexible sigmoidoscopy) you may notice spotting of blood in your stool or on the toilet paper. If you underwent a bowel prep for your procedure, you may not have a normal bowel movement for a few days.  Please Note:  You might notice some irritation and congestion in your nose or some drainage.  This is from the oxygen used during your procedure.  There is no need for concern and it should clear up in a day or so.  SYMPTOMS TO REPORT IMMEDIATELY:   Following lower endoscopy (colonoscopy or flexible sigmoidoscopy):  Excessive amounts of blood in the stool  Significant tenderness or worsening of abdominal pains  Swelling of the abdomen that is new, acute  Fever of 100F or higher  For urgent or emergent issues, a gastroenterologist can be reached at any hour by calling [phone].   DIET: Your first meal following the procedure should be a small meal  and then it is ok to progress to your normal diet. Heavy or fried foods are harder to digest and may make you feel nauseous or bloated.  Likewise, meals heavy in dairy and vegetables can increase bloating.  Drink plenty of fluids but you should avoid alcoholic beverages for 24 hours.  ACTIVITY:  You should plan to take it easy for the rest of today and you should NOT DRIVE or use heavy machinery until tomorrow (because of the sedation medicines used during the test).    FOLLOW UP: Our staff will call the number listed on your records the next business day following your procedure to check on you and address any questions or concerns that you may have regarding the information given to you following your procedure. If we do not reach you, we will leave a message.  However, if you are feeling well and you are not experiencing any problems, there is no need to return our call.  We will assume that you have returned to your regular daily activities without incident.  If any biopsies were taken you will be contacted by phone or by letter within the next 1-3 weeks.  Please call us at [phone] if you have not heard about the biopsies in 3 weeks.    SIGNATURES/CONFIDENTIALITY: You and/or your care partner have signed paperwork which will be entered into your electronic medical record.  These signatures attest to the fact that that the information above on your After Visit  Summary has been reviewed and is understood.  Full responsibility of the confidentiality of this discharge information lies with you and/or your care-partner. 

## 2015-11-25 NOTE — Progress Notes (Signed)
Called to room to assist during endoscopic procedure.  Patient ID and intended procedure confirmed with present staff. Received instructions for my participation in the procedure from the performing physician.  

## 2015-11-25 NOTE — Progress Notes (Signed)
Report to PACU, RN, vss, BBS= Clear.  

## 2015-11-26 ENCOUNTER — Telehealth: Payer: Self-pay

## 2015-11-26 NOTE — Telephone Encounter (Signed)
  Follow up Call-  Call back number 11/25/2015  Post procedure Call Back phone  # [phone]  Permission to leave phone message Yes    Patient was called for follow up after procedure on 11/25/2015. I spoke to the patients wife who reports that David Gilmore has returned to his normal daily activities without any difficulty.

## 2015-12-01 ENCOUNTER — Encounter: Payer: Self-pay | Admitting: Gastroenterology

## 2016-04-05 DIAGNOSIS — F5221 Male erectile disorder: Secondary | ICD-10-CM | POA: Diagnosis not present

## 2016-04-05 DIAGNOSIS — R9439 Abnormal result of other cardiovascular function study: Secondary | ICD-10-CM | POA: Diagnosis not present

## 2016-04-05 DIAGNOSIS — I10 Essential (primary) hypertension: Secondary | ICD-10-CM | POA: Diagnosis not present

## 2016-04-05 DIAGNOSIS — E785 Hyperlipidemia, unspecified: Secondary | ICD-10-CM | POA: Diagnosis not present

## 2016-04-19 ENCOUNTER — Ambulatory Visit (INDEPENDENT_AMBULATORY_CARE_PROVIDER_SITE_OTHER): Payer: Medicare Other | Admitting: Family Medicine

## 2016-04-19 ENCOUNTER — Encounter: Payer: Self-pay | Admitting: Family Medicine

## 2016-04-19 DIAGNOSIS — I10 Essential (primary) hypertension: Secondary | ICD-10-CM

## 2016-04-19 DIAGNOSIS — E785 Hyperlipidemia, unspecified: Secondary | ICD-10-CM | POA: Diagnosis not present

## 2016-04-19 NOTE — Progress Notes (Signed)
Pre visit review using our clinic review tool, if applicable. No additional management support is needed unless otherwise documented below in the visit note. 

## 2016-04-19 NOTE — Assessment & Plan Note (Signed)
S: well controlled on crestor 20mg  with LDL <70. No myalgias.  Lab Results  Component Value Date   CHOL 180 06/15/2015   HDL 37.30 (L) 06/15/2015   LDLCALC 128 (H) 06/15/2015   LDLDIRECT 60.0 10/29/2015   TRIG 74.0 06/15/2015   CHOLHDL 5 06/15/2015   A/P: doing very well with LDL <70- continue current dose of medicine

## 2016-04-19 NOTE — Patient Instructions (Signed)
Trial off bystolic to see if it helps erectle issues  Schedule a visit on November 7th with David Gilmore.  Info:  I would also like for you to sign up for an annual wellness visit with our nurse, David Gilmore, who specializes in the annual wellness exam. This is a free benefit under medicare that may help [place] find additional ways to help you. Some highlights are reviewing medications, lifestyle, and doing a dementia screen.    Take this paper with you: If your blood pressure is over 140/90 I want to be made aware before you leave so I can have you on our side and do a recheck. If still high- would restart bystolic and send you in some viagra  If blood pressure under these goals but you are still having erectile issues- send in viagra.

## 2016-04-19 NOTE — Assessment & Plan Note (Signed)
S: controlled on lisinopril 10-hctz 12.5 mg, metoprolol 25 XL switched to bystolic 5mg  to try to help sexual drive/ED but has not BP Readings from Last 3 Encounters:  04/19/16 110/76  11/25/15 133/68  10/29/15 110/72  A/P: trial off bystolic to see if helps with ED. Follow up with [facility] in about 3 weeks. If BP <140/90 and no ED- remain off med. If BP <140/90 and ED remains- start viagra. If BP Q000111Q restart bystolic and start viagra.

## 2016-04-19 NOTE — Progress Notes (Signed)
Subjective:  David Gilmore is a 65 y.o. year old very pleasant male patient who presents for/with See problem oriented charting ROS- No chest pain or shortness of breath. No headache or blurry vision. see any ROS included in HPI as well.   Past Medical History-  Patient Active Problem List   Diagnosis Date Noted  . Essential hypertension 06/22/2015    Priority: Medium  . Hyperlipidemia     Priority: Medium  . Abnormal stress test 07/27/2015    Priority: Low  . Former smoker 06/15/2015    Priority: Low    Medications- reviewed and updated Current Outpatient Prescriptions  Medication Sig Dispense Refill  . aspirin 81 MG tablet Take 81 mg by mouth daily.    Marland Kitchen lisinopril-hydrochlorothiazide (PRINZIDE,ZESTORETIC) 10-12.5 MG tablet Take 1 tablet by mouth daily.    . Multiple Vitamins-Minerals (CENTRUM SILVER ADULT 50+) TABS Take by mouth.    . nebivolol (BYSTOLIC) 5 MG tablet Take 5 mg by mouth daily.    . rosuvastatin (CRESTOR) 20 MG tablet Take 20 mg by mouth daily.     No current facility-administered medications for this visit.     Objective: BP 110/76 (BP Location: Left Arm, Patient Position: Sitting, Cuff Size: Normal)   Pulse 95   Temp 98.3 F (36.8 C) (Oral)   Wt 175 lb 6.4 oz (79.6 kg)   SpO2 95%   BMI 24.46 kg/m  Gen: NAD, resting comfortably CV: RRR no murmurs rubs or gallops Lungs: CTAB no crackles, wheeze, rhonchi Abdomen: soft/nontender/nondistended/normal bowel sounds. Normal weight Ext: no edema Skin: warm, dry  Assessment/Plan:  Hyperlipidemia S: well controlled on crestor 20mg  with LDL <70. No myalgias.  Lab Results  Component Value Date   CHOL 180 06/15/2015   HDL 37.30 (L) 06/15/2015   LDLCALC 128 (H) 06/15/2015   LDLDIRECT 60.0 10/29/2015   TRIG 74.0 06/15/2015   CHOLHDL 5 06/15/2015   A/P: doing very well with LDL <70- continue current dose of medicine  Essential hypertension S: controlled on lisinopril 10-hctz 12.5 mg, metoprolol 25  XL switched to bystolic 5mg  to try to help sexual drive/ED but has not BP Readings from Last 3 Encounters:  04/19/16 110/76  11/25/15 133/68  10/29/15 110/72  A/P: trial off bystolic to see if helps with ED. Follow up with [facility] in about 3 weeks. If BP <140/90 and no ED- remain off med. If BP <140/90 and ED remains- start viagra. If BP Q000111Q restart bystolic and start viagra.   Meds ordered this encounter  Medications  . nebivolol (BYSTOLIC) 5 MG tablet    Sig: Take 5 mg by mouth daily.    Return precautions advised.  [name], MD

## 2016-05-10 ENCOUNTER — Encounter: Payer: Self-pay | Admitting: Family Medicine

## 2016-05-10 ENCOUNTER — Ambulatory Visit (INDEPENDENT_AMBULATORY_CARE_PROVIDER_SITE_OTHER): Payer: Medicare Other | Admitting: Family Medicine

## 2016-05-10 VITALS — BP 132/88 | HR 94 | Temp 97.7°F | Wt 176.6 lb

## 2016-05-10 DIAGNOSIS — N529 Male erectile dysfunction, unspecified: Secondary | ICD-10-CM

## 2016-05-10 DIAGNOSIS — Z87891 Personal history of nicotine dependence: Secondary | ICD-10-CM

## 2016-05-10 DIAGNOSIS — I10 Essential (primary) hypertension: Secondary | ICD-10-CM

## 2016-05-10 DIAGNOSIS — Z0001 Encounter for general adult medical examination with abnormal findings: Secondary | ICD-10-CM

## 2016-05-10 DIAGNOSIS — Z0000 Encounter for general adult medical examination without abnormal findings: Secondary | ICD-10-CM | POA: Diagnosis not present

## 2016-05-10 MED ORDER — SILDENAFIL CITRATE 100 MG PO TABS
100.0000 mg | ORAL_TABLET | Freq: Every day | ORAL | 11 refills | Status: DC | PRN
Start: 2016-05-10 — End: 2017-08-16

## 2016-05-10 MED ORDER — LISINOPRIL-HYDROCHLOROTHIAZIDE 20-25 MG PO TABS: 1.0000 | ORAL_TABLET | Freq: Every day | ORAL | 3 refills | Status: DC

## 2016-05-10 NOTE — Progress Notes (Signed)
Pre visit review using our clinic review tool, if applicable. No additional management support is needed unless otherwise documented below in the visit note. 
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
## 2016-05-10 NOTE — Patient Instructions (Signed)
  Mr. David Gilmore , Thank you for taking time to come for your Medicare Wellness Visit. I appreciate your ongoing commitment to your health goals. Please review the following plan we discussed and let me know if I can assist you in the future.   These are the goals we discussed: 1. We will call you within a week about your referral to aneurysm screening. If you do not hear within 2 weeks, give [place] a call.  2. Continue off bystolic 3. Increase lisinopril-hctz dose from 20-25mg . Blood pressure technically at goal but closer to goal than I would like. You can go to half pill of this if erectile issues worse. Change once you run out of current medicine.  4. Start walking with your wife 5. Trial viagra for erectile issues- start with half pill   This is a list of the screening recommended for you and due dates:  Health Maintenance  Topic Date Due  . Tetanus Vaccine  10/17/1968  . Shingles Vaccine  10/28/2040*  . Flu Shot  07/26/2065*  . Pneumonia vaccines (2 of 2 - PPSV23) 07/26/2016  . Colon Cancer Screening  11/24/2020  .  Hepatitis C: One time screening is recommended by Center for Disease Control  (CDC) for  adults born from [birth year] through 1966.   Completed  *Topic was postponed. The date shown is not the original due date.

## 2016-05-10 NOTE — Assessment & Plan Note (Signed)
S: controlled on lisinopril 10-hctz 12.5mg . Had been on metoprolol then later bystolic to see if helped with ED or sexual drive but did not. trialed off bystolic as well as of last visit. ED= IMproved and able to get erection but not as firm as he would like.  BP Readings from Last 3 Encounters:  05/10/16 (!) 130/92--> 132/88  04/19/16 110/76  11/25/15 133/68  A/P:Continue current meds:  But titrate to 20-25mg  of lisinopril-hctz. If ED issues again can go back to half tablet which is current dose. Can trial viagra for ED as well. Also advised to go on walks with wife which may help with BP

## 2016-06-22 ENCOUNTER — Ambulatory Visit (HOSPITAL_COMMUNITY)
Admission: RE | Admit: 2016-06-22 | Discharge: 2016-06-22 | Disposition: A | Discharge status: Home / Self Care | Payer: Medicare Other | Source: Ambulatory Visit | Attending: Cardiovascular Disease | Admitting: Cardiovascular Disease

## 2016-06-22 DIAGNOSIS — I700 Atherosclerosis of aorta: Secondary | ICD-10-CM | POA: Diagnosis not present

## 2016-06-22 DIAGNOSIS — Z87891 Personal history of nicotine dependence: Secondary | ICD-10-CM | POA: Insufficient documentation

## 2016-06-22 DIAGNOSIS — I10 Essential (primary) hypertension: Secondary | ICD-10-CM | POA: Diagnosis not present

## 2016-06-22 DIAGNOSIS — I708 Atherosclerosis of other arteries: Secondary | ICD-10-CM | POA: Diagnosis not present

## 2016-08-26 ENCOUNTER — Telehealth: Payer: Self-pay

## 2016-08-26 NOTE — Telephone Encounter (Signed)
Pt returned your call and ask that you pls call on the cell phone.

## 2016-08-26 NOTE — Telephone Encounter (Signed)
-----   Message from [name], MD sent at 08/25/2016 10:52 PM EST ----- Got letter from insurance about not taking lisinopril/hctz 20-25mg . This may be if he is taking half dose due to ED issues- do you mind checking that he is at least taking a half dose for me?  David Gilmore

## 2016-08-26 NOTE — Telephone Encounter (Signed)
Called and left a voicemail message asking for a return phone call 

## 2016-08-30 NOTE — Telephone Encounter (Signed)
Spoke with patient who states he is taking half a dose.

## 2016-08-30 NOTE — Telephone Encounter (Signed)
Called and left a voicemail asking for a return phone call 

## 2016-08-30 NOTE — Telephone Encounter (Signed)
Perfect thanks- no further follow up needed- that's why it appears not taking full rx

## 2016-12-22 ENCOUNTER — Other Ambulatory Visit: Payer: Self-pay

## 2016-12-22 ENCOUNTER — Telehealth: Payer: Self-pay | Admitting: Family Medicine

## 2016-12-22 MED ORDER — ROSUVASTATIN CALCIUM 20 MG PO TABS
20.0000 mg | ORAL_TABLET | Freq: Every day | ORAL | 3 refills | Status: DC
Start: 2016-12-22 — End: 2018-02-16

## 2016-12-22 NOTE — Telephone Encounter (Signed)
Prescription sent to pharmacy as requested.

## 2016-12-22 NOTE — Telephone Encounter (Signed)
Pt need new Rx for rosuvastatin #90  Pharm:  [facility] on [address].  Pt is out of Rx.

## 2017-07-07 ENCOUNTER — Other Ambulatory Visit: Payer: Self-pay | Admitting: Family Medicine

## 2017-08-16 ENCOUNTER — Ambulatory Visit (INDEPENDENT_AMBULATORY_CARE_PROVIDER_SITE_OTHER): Payer: Medicare Other | Admitting: Family Medicine

## 2017-08-16 ENCOUNTER — Encounter: Payer: Self-pay | Admitting: Family Medicine

## 2017-08-16 ENCOUNTER — Ambulatory Visit (INDEPENDENT_AMBULATORY_CARE_PROVIDER_SITE_OTHER): Payer: Medicare Other

## 2017-08-16 VITALS — BP 128/82 | HR 109 | Temp 98.1°F | Ht 71.0 in | Wt 180.8 lb

## 2017-08-16 DIAGNOSIS — Z23 Encounter for immunization: Secondary | ICD-10-CM

## 2017-08-16 DIAGNOSIS — Z125 Encounter for screening for malignant neoplasm of prostate: Secondary | ICD-10-CM

## 2017-08-16 DIAGNOSIS — I700 Atherosclerosis of aorta: Secondary | ICD-10-CM

## 2017-08-16 DIAGNOSIS — R0602 Shortness of breath: Secondary | ICD-10-CM

## 2017-08-16 DIAGNOSIS — R351 Nocturia: Secondary | ICD-10-CM

## 2017-08-16 DIAGNOSIS — Z8042 Family history of malignant neoplasm of prostate: Secondary | ICD-10-CM

## 2017-08-16 DIAGNOSIS — E785 Hyperlipidemia, unspecified: Secondary | ICD-10-CM

## 2017-08-16 DIAGNOSIS — R05 Cough: Secondary | ICD-10-CM

## 2017-08-16 DIAGNOSIS — R053 Chronic cough: Secondary | ICD-10-CM

## 2017-08-16 DIAGNOSIS — N401 Enlarged prostate with lower urinary tract symptoms: Secondary | ICD-10-CM | POA: Diagnosis not present

## 2017-08-16 DIAGNOSIS — I10 Essential (primary) hypertension: Secondary | ICD-10-CM

## 2017-08-16 DIAGNOSIS — Z87891 Personal history of nicotine dependence: Secondary | ICD-10-CM | POA: Diagnosis not present

## 2017-08-16 LAB — COMPREHENSIVE METABOLIC PANEL
ALBUMIN: 4.4 g/dL (ref 3.5–5.2)
ALT: 27 U/L (ref 0–53)
AST: 22 U/L (ref 0–37)
Alkaline Phosphatase: 50 U/L (ref 39–117)
BUN: 14 mg/dL (ref 6–23)
CALCIUM: 10 mg/dL (ref 8.4–10.5)
CHLORIDE: 100 meq/L (ref 96–112)
CO2: 29 mEq/L (ref 19–32)
Creatinine, Ser: 1.04 mg/dL (ref 0.40–1.50)
GFR: 91.38 mL/min (ref >60.00)
Glucose, Bld: 85 mg/dL (ref 70–99)
POTASSIUM: 4.7 meq/L (ref 3.5–5.1)
SODIUM: 136 meq/L (ref 135–145)
Total Bilirubin: 0.5 mg/dL (ref 0.2–1.2)
Total Protein: 7.9 g/dL (ref 6.0–8.3)

## 2017-08-16 LAB — POC URINALSYSI DIPSTICK (AUTOMATED)
BILIRUBIN UA: NEGATIVE
Blood, UA: NEGATIVE
Glucose, UA: NEGATIVE
KETONES UA: NEGATIVE
LEUKOCYTES UA: NEGATIVE
NITRITE UA: NEGATIVE
PH UA: 6 (ref 5.0–8.0)
Protein, UA: NEGATIVE
Spec Grav, UA: >=1.03 — AB (ref 1.010–1.025)
Urobilinogen, UA: 0.2 E.U./dL

## 2017-08-16 LAB — LIPID PANEL
CHOLESTEROL: 131 mg/dL (ref 0–200)
HDL: 37.1 mg/dL — AB (ref >39.00)
LDL CALC: 75 mg/dL (ref 0–99)
NonHDL: 94.28
Total CHOL/HDL Ratio: 4
Triglycerides: 95 mg/dL (ref 0.0–149.0)
VLDL: 19 mg/dL (ref 0.0–40.0)

## 2017-08-16 LAB — PSA: PSA: 2.4 ng/mL (ref 0.10–4.00)

## 2017-08-16 LAB — CBC
HCT: 29 % — ABNORMAL LOW (ref 39.0–52.0)
MCHC: 30.6 g/dL (ref 30.0–36.0)
MCV: 62.4 fl — ABNORMAL LOW (ref 78.0–100.0)
PLATELETS: 312 10*3/uL (ref 150.0–400.0)
RBC: 4.65 Mil/uL (ref 4.22–5.81)
RDW: 16.8 % — ABNORMAL HIGH (ref 11.5–15.5)
WBC: 4.3 10*3/uL (ref 4.0–10.5)

## 2017-08-16 MED ORDER — AMLODIPINE BESYLATE 5 MG PO TABS: 5.0000 mg | ORAL_TABLET | Freq: Every day | ORAL | 3 refills | Status: DC

## 2017-08-16 MED ORDER — HYDROCHLOROTHIAZIDE 25 MG PO TABS: 25.0000 mg | ORAL_TABLET | Freq: Every day | ORAL | 3 refills | Status: DC

## 2017-08-16 NOTE — Assessment & Plan Note (Signed)
S: uses push mower and has hills. He has to do in 2 sections now and has to rest in between. Feels tired with this. Denies chest pain. Feels short of breath  Has had chronic cough for over a year- we have discussed this potentially being allergies reltaed. Gets a very hard cough. Zyrtec didn't help A/P: will get CXR for shortness of breath since 2017 reportedly but worsening in last year. Refer back to Dr. Einar Gip- held off on EKG as he will complete  For chronic cough portion- stop lisinopril. DId not note elevated HR on my exam though noted in vitals

## 2017-08-16 NOTE — Addendum Note (Signed)
Addended by: Frutoso Chase A on: 08/16/2017 10:19 AM   Modules accepted: Orders

## 2017-08-16 NOTE — Assessment & Plan Note (Signed)
S: controlled on lisinopril hctz 20-25mg . Erectile issues on beta blocker BP Readings from Last 3 Encounters:  08/16/17 128/82  05/10/16 132/88  04/19/16 110/76  A/P: We discussed blood pressure goal of <140/90. Continue current meds:   Stop combo pill  Start amlodipine 5mg  Start hydrochlorothiazide 25mg  Recheck 1 week

## 2017-08-16 NOTE — Assessment & Plan Note (Signed)
S: reasonably controlled in past on crestor 20mg . No myalgias.  Lab Results  Component Value Date   CHOL 180 06/15/2015   HDL 37.30 (L) 06/15/2015   LDLCALC 128 (H) 06/15/2015   LDLDIRECT 60.0 10/29/2015   TRIG 74.0 06/15/2015   CHOLHDL 5 06/15/2015   A/P: update lipids today

## 2017-08-16 NOTE — Assessment & Plan Note (Signed)
bph noted with nocturia 1-2x a night with no recent worsening. Prostate cancer screening- family history in father plus being black male has increased risk. Will get PSA today.

## 2017-08-16 NOTE — Addendum Note (Signed)
Addended by: Kayren Eaves T on: 08/16/2017 10:34 AM   Modules accepted: Orders

## 2017-08-16 NOTE — Assessment & Plan Note (Signed)
S: 30 pack year smoking history- quit 2000. Would not qualify for lung cancer screening. AAA screen 06/22/16 reassuring though did have aortic atherosclerosis A/P: get UA with labs to monitor for hematuria, otherwise up to date on smoking based screening

## 2017-08-16 NOTE — Assessment & Plan Note (Signed)
S: noted on AAA screen. No signs of aneurysm A/P: continue risk factor modification with BP control, lipid control.

## 2017-08-16 NOTE — Progress Notes (Signed)
Subjective:  David Gilmore is a 67 y.o. year old very pleasant male patient who presents for/with See problem oriented charting ROS- No exertional chest pain or edema. No headache or blurry vision.  Some fatigue and shortness of breath. Some indigestion after meals. Chronic cough, fatigue  Past Medical History-  Patient Active Problem List   Diagnosis Date Noted  . Essential hypertension 06/22/2015    Priority: Medium  . Hyperlipidemia     Priority: Medium  . Abnormal stress test 07/27/2015    Priority: Low  . Former smoker 06/15/2015    Priority: Low  . Aortic atherosclerosis ([place]) 08/16/2017  . BPH associated with nocturia 08/16/2017  . Shortness of breath 08/16/2017    Medications- reviewed and updated Current Outpatient Medications  Medication Sig Dispense Refill  . aspirin 81 MG tablet Take 81 mg by mouth daily.    Marland Kitchen lisinopril-hydrochlorothiazide (PRINZIDE,ZESTORETIC) 20-25 MG tablet TAKE ONE TABLET BY MOUTH ONCE DAILY 90 tablet 3  . Multiple Vitamins-Minerals (CENTRUM SILVER ADULT 50+) TABS Take by mouth.    . rosuvastatin (CRESTOR) 20 MG tablet Take 1 tablet (20 mg total) by mouth daily. 90 tablet 3   Objective: BP 128/82 (BP Location: Left Arm, Patient Position: Sitting, Cuff Size: Large)   Pulse (!) 109   Temp 98.1 F (36.7 C) (Oral)   Ht 5\' 11"  (1.803 m)   Wt 180 lb 12.8 oz (82 kg)   SpO2 98%   BMI 25.22 kg/m  Gen: NAD, resting comfortably TM normal, some erythema in pharynx CV: RRR no murmurs rubs or gallops Lungs: CTAB no crackles, wheeze, rhonchi Abdomen: soft/nontender/nondistended/normal bowel sounds. No rebound or guarding.  Ext: no edema Skin: warm, dry Neuro: grossly normal, moves all extremities Rectal: normal tone, diffusely enlarged prostate, no masses or tenderness  Assessment/Plan:  Immunization History  Administered Date(s) Administered  . Pneumococcal Conjugate-13 07/27/2015  . Pneumococcal Polysaccharide-23 08/16/2017   Health  Maintenance Duej  Topic Date Due  . Samul Dada - advised get at pharmacy 10[birth year]17/1968  Colonoscopy due 2022  Hyperlipidemia S: reasonably controlled in past on crestor 20mg . No myalgias.  Lab Results  Component Value Date   CHOL 180 06/15/2015   HDL 37.30 (L) 06/15/2015   LDLCALC 128 (H) 06/15/2015   LDLDIRECT 60.0 10/29/2015   TRIG 74.0 06/15/2015   CHOLHDL 5 06/15/2015   A/P: update lipids today  Former smoker S: 30 pack year smoking history- quit 2000. Would not qualify for lung cancer screening. AAA screen 06/22/16 reassuring though did have aortic atherosclerosis A/P: get UA with labs to monitor for hematuria, otherwise up to date on smoking based screening  Aortic atherosclerosis ([place]) S: noted on AAA screen. No signs of aneurysm A/P: continue risk factor modification with BP control, lipid control.    Essential hypertension S: controlled on lisinopril hctz 20-25mg . Erectile issues on beta blocker BP Readings from Last 3 Encounters:  08/16/17 128/82  05/10/16 132/88  04/19/16 110/76  A/P: We discussed blood pressure goal of <140/90. Continue current meds:   Stop combo pill  Start amlodipine 5mg  Start hydrochlorothiazide 25mg  Recheck 1 week   BPH associated with nocturia bph noted with nocturia 1-2x a night with no recent worsening. Prostate cancer screening- family history in father plus being black male has increased risk. Will get PSA today.   Shortness of breath S: uses push mower and has hills. He has to do in 2 sections now and has to rest in between. Feels tired with this. Denies chest pain.  Feels short of breath  Has had chronic cough for over a year- we have discussed this potentially being allergies reltaed. Gets a very hard cough. Zyrtec didn't help A/P: will get CXR for shortness of breath since 2017 reportedly but worsening in last year. Refer back to Dr. Einar Gip- held off on EKG as he will complete  For chronic cough portion- stop lisinopril. DId not  note elevated HR on my exam though noted in vitals  Return in about 1 week (around 08/23/2017) for blood pressur erecheck.  Lab/Order associations: Essential hypertension - Plan: CBC, Comprehensive metabolic panel, Lipid panel, POCT Urinalysis Dipstick (Automated)  Hyperlipidemia, unspecified hyperlipidemia type - Plan: CBC, Comprehensive metabolic panel, Lipid panel, POCT Urinalysis Dipstick (Automated)  Former smoker - Plan: POCT Urinalysis Dipstick (Automated)  Aortic atherosclerosis (HCC)  Need for prophylactic vaccination against Streptococcus pneumoniae (pneumococcus) - Plan: Pneumococcal polysaccharide vaccine 23-valent greater than or equal to 2yo subcutaneous/IM  Family history of prostate cancer - Plan: PSA  Screening for prostate cancer - Plan: PSA  Nocturia - Plan: PSA  Chronic cough - Plan: DG Chest 2 View  Shortness of breath - Plan: Ambulatory referral to Cardiology  Meds ordered this encounter  Medications  . hydrochlorothiazide (HYDRODIURIL) 25 MG tablet    Sig: Take 1 tablet (25 mg total) by mouth daily.    Dispense:  90 tablet    Refill:  3  . amLODipine (NORVASC) 5 MG tablet    Sig: Take 1 tablet (5 mg total) by mouth daily.    Dispense:  90 tablet    Refill:  3   Return precautions advised.  [name], MD

## 2017-08-16 NOTE — Patient Instructions (Addendum)
Stop combo pill --> lisinopril hctz 20-25mg .   Start amlodipine 5mg  Start hydrochlorothiazide 25mg  Recheck 1 week   You are due for tetanus shot as well if you want to get this at your pharmacy  We will call you within a week or two about your referral to Dr. Einar Gip. If you do not hear within 3 weeks, give [place] a call.   Please stop by lab before you go- they will also do your x-ray

## 2017-08-18 ENCOUNTER — Other Ambulatory Visit: Payer: Self-pay

## 2017-08-18 DIAGNOSIS — D649 Anemia, unspecified: Secondary | ICD-10-CM

## 2017-08-23 ENCOUNTER — Ambulatory Visit (INDEPENDENT_AMBULATORY_CARE_PROVIDER_SITE_OTHER): Payer: Medicare Other | Admitting: Family Medicine

## 2017-08-23 ENCOUNTER — Encounter: Payer: Self-pay | Admitting: Family Medicine

## 2017-08-23 VITALS — BP 135/88 | HR 102 | Temp 98.1°F | Ht 71.0 in | Wt 181.8 lb

## 2017-08-23 DIAGNOSIS — D649 Anemia, unspecified: Secondary | ICD-10-CM | POA: Diagnosis not present

## 2017-08-23 DIAGNOSIS — I10 Essential (primary) hypertension: Secondary | ICD-10-CM

## 2017-08-23 LAB — IRON: IRON: 205 ug/dL — AB (ref 42–165)

## 2017-08-23 LAB — CBC
HCT: 29.8 % — ABNORMAL LOW (ref 39.0–52.0)
Hemoglobin: 9.1 g/dL — ABNORMAL LOW (ref 13.0–17.0)
MCHC: 30.4 g/dL (ref 30.0–36.0)
MCV: 63.7 fl — ABNORMAL LOW (ref 78.0–100.0)
PLATELETS: 279 10*3/uL (ref 150.0–400.0)
RBC: 4.68 Mil/uL (ref 4.22–5.81)
RDW: 17 % — AB (ref 11.5–15.5)
WBC: 5.7 10*3/uL (ref 4.0–10.5)

## 2017-08-23 LAB — BASIC METABOLIC PANEL
BUN: 14 mg/dL (ref 6–23)
CHLORIDE: 100 meq/L (ref 96–112)
CO2: 30 mEq/L (ref 19–32)
Calcium: 10.3 mg/dL (ref 8.4–10.5)
Creatinine, Ser: 1.09 mg/dL (ref 0.40–1.50)
GFR: 86.56 mL/min (ref >60.00)
Glucose, Bld: 88 mg/dL (ref 70–99)
Potassium: 3.8 mEq/L (ref 3.5–5.1)
Sodium: 136 mEq/L (ref 135–145)

## 2017-08-23 LAB — FERRITIN: FERRITIN: 8 ng/mL — AB (ref 22.0–322.0)

## 2017-08-23 NOTE — Progress Notes (Signed)
Subjective:  David Gilmore is a 67 y.o. year old very pleasant male patient who presents for/with See problem oriented charting ROS- No chest pain. Continued shortness of breath. No headache or blurry vision.    Past Medical History-  Patient Active Problem List   Diagnosis Date Noted  . Anemia 08/23/2017    Priority: High  . Shortness of breath 08/16/2017    Priority: Medium  . Essential hypertension 06/22/2015    Priority: Medium  . Hyperlipidemia     Priority: Medium  . Abnormal stress test 07/27/2015    Priority: Low  . Former smoker 06/15/2015    Priority: Low  . Aortic atherosclerosis ([place]) 08/16/2017  . BPH associated with nocturia 08/16/2017    Medications- reviewed and updated Current Outpatient Medications  Medication Sig Dispense Refill  . amLODipine (NORVASC) 5 MG tablet Take 1 tablet (5 mg total) by mouth daily. 90 tablet 3  . aspirin 81 MG tablet Take 81 mg by mouth daily.    . Ferrous Gluconate (IRON 27 PO) Take 1 tablet by mouth 2 (two) times daily.    . hydrochlorothiazide (HYDRODIURIL) 25 MG tablet Take 1 tablet (25 mg total) by mouth daily. 90 tablet 3  . Multiple Vitamins-Minerals (CENTRUM SILVER ADULT 50+) TABS Take by mouth.    . rosuvastatin (CRESTOR) 20 MG tablet Take 1 tablet (20 mg total) by mouth daily. 90 tablet 3     Objective: BP 135/88 (BP Location: Left Arm, Patient Position: Sitting, Cuff Size: Large)   Pulse (!) 102   Temp 98.1 F (36.7 C) (Oral)   Ht 5\' 11"  (1.803 m)   Wt 181 lb 12.8 oz (82.5 kg)   SpO2 98%   BMI 25.36 kg/m  Gen: NAD, resting comfortably No obvious mucus membrane pallor. CV: RRR no murmurs rubs or gallops not tachycardic on my exam- does usually have high normal heart rate though Lungs: CTAB no crackles, wheeze, rhonchi Abdomen: soft/nontender/nondistended/normal bowel sounds. Ext: no edema Skin: warm, dry  Assessment/Plan:  Essential hypertension S: controlled on amlodipine 5mg , hctz 25mg . His  lisinopril was stopped last visit due to chronic cough. He states cough has drastically improved (not fully gone since coming off medicine). Chest x-ray without obvious cause of the chronic cough BP Readings from Last 3 Encounters:  08/23/17 135/88  08/16/17 128/82  05/10/16 132/88  A/P: We discussed blood pressure goal of <140/90. Continue current meds- amlodipine controlling BP and much less cough. HCTZ 25mg - updated bmet without hypokalemia  Anemia S: Patient complained of DOE last week with mowing gross. He has an appointment to see Dr. Einar Gip next week.   With that being said he was found to be anemic with hemoglobin around 9.  No obvious source of bleeding.  No bright red blood per rectum.  No melena.  No hematochezia.  No hemoptysis.  No hematuria.  He has started iron supplements daily.  Asked him to bring what he is using the next visit. A/P: I am unsure of cause of anemia.  Ferritin and iron are low.  He is up-to-date on his colonoscopy but I will refer back for GI opinion-may need updated colonoscopy and endoscopy.  This may very well be the cause of his shortness of breath but as it is been sometime since he has seen Dr. Karolee Ohs keep appointment for next wednesday  Will repeat CBC in 2 weeks.  Lab/Order associations: Essential hypertension - Plan: Basic metabolic panel  Low hemoglobin - Plan: Iron, Ferritin, CBC  Return precautions advised.  [name], MD

## 2017-08-23 NOTE — Assessment & Plan Note (Addendum)
S: Patient complained of DOE last week with mowing gross. He has an appointment to see Dr. Einar Gip next week.   With that being said he was found to be anemic with hemoglobin around 9.  No obvious source of bleeding.  No bright red blood per rectum.  No melena.  No hematochezia.  No hemoptysis.  No hematuria.  He has started iron supplements daily.  Asked him to bring what he is using the next visit. A/P: I am unsure of cause of anemia.  Ferritin and iron are low.  He is up-to-date on his colonoscopy but I will refer back for GI opinion-may need updated colonoscopy and endoscopy.  This may very well be the cause of his shortness of breath but as it is been sometime since he has seen Dr. Karolee Ohs keep appointment for next wednesday

## 2017-08-23 NOTE — Assessment & Plan Note (Signed)
S: controlled on amlodipine 5mg , hctz 25mg . His lisinopril was stopped last visit due to chronic cough. He states cough has drastically improved (not fully gone since coming off medicine). Chest x-ray without obvious cause of the chronic cough BP Readings from Last 3 Encounters:  08/23/17 135/88  08/16/17 128/82  05/10/16 132/88  A/P: We discussed blood pressure goal of <140/90. Continue current meds- amlodipine controlling BP and much less cough. HCTZ 25mg - updated bmet without hypokalemia

## 2017-08-23 NOTE — Patient Instructions (Addendum)
Please stop by lab before you go  Continue iron daily  Blood pressure looks fine- continue current medicine

## 2017-08-28 ENCOUNTER — Other Ambulatory Visit: Payer: Self-pay

## 2017-08-28 DIAGNOSIS — D649 Anemia, unspecified: Secondary | ICD-10-CM

## 2017-08-29 DIAGNOSIS — R0609 Other forms of dyspnea: Secondary | ICD-10-CM | POA: Diagnosis not present

## 2017-08-29 DIAGNOSIS — R0789 Other chest pain: Secondary | ICD-10-CM | POA: Diagnosis not present

## 2017-08-29 DIAGNOSIS — R9439 Abnormal result of other cardiovascular function study: Secondary | ICD-10-CM | POA: Diagnosis not present

## 2017-08-29 DIAGNOSIS — E785 Hyperlipidemia, unspecified: Secondary | ICD-10-CM | POA: Diagnosis not present

## 2017-09-08 ENCOUNTER — Other Ambulatory Visit (INDEPENDENT_AMBULATORY_CARE_PROVIDER_SITE_OTHER): Payer: Medicare Other

## 2017-09-08 DIAGNOSIS — D649 Anemia, unspecified: Secondary | ICD-10-CM | POA: Diagnosis not present

## 2017-09-08 LAB — CBC
HCT: 32.6 % — ABNORMAL LOW (ref 38.5–50.0)
HEMOGLOBIN: 9.5 g/dL — AB (ref 13.2–17.1)
MCH: 20.4 pg — AB (ref 27.0–33.0)
MCHC: 29.1 g/dL — AB (ref 32.0–36.0)
MCV: 70.1 fL — ABNORMAL LOW (ref 80.0–100.0)
MPV: 9.5 fL (ref 7.5–12.5)
Platelets: 382 10*3/uL (ref 140–400)
RBC: 4.65 10*6/uL (ref 4.20–5.80)
RDW: 21.3 % — AB (ref 11.0–15.0)
WBC: 4.1 10*3/uL (ref 3.8–10.8)

## 2017-09-11 DIAGNOSIS — R0789 Other chest pain: Secondary | ICD-10-CM | POA: Diagnosis not present

## 2017-09-11 DIAGNOSIS — Z01810 Encounter for preprocedural cardiovascular examination: Secondary | ICD-10-CM | POA: Diagnosis not present

## 2017-09-11 DIAGNOSIS — R0609 Other forms of dyspnea: Secondary | ICD-10-CM | POA: Diagnosis not present

## 2017-09-26 DIAGNOSIS — I10 Essential (primary) hypertension: Secondary | ICD-10-CM | POA: Diagnosis not present

## 2017-09-26 DIAGNOSIS — R0789 Other chest pain: Secondary | ICD-10-CM | POA: Diagnosis not present

## 2017-09-26 DIAGNOSIS — Z01810 Encounter for preprocedural cardiovascular examination: Secondary | ICD-10-CM | POA: Diagnosis not present

## 2017-09-26 DIAGNOSIS — R0609 Other forms of dyspnea: Secondary | ICD-10-CM | POA: Diagnosis not present

## 2017-10-04 DIAGNOSIS — R9439 Abnormal result of other cardiovascular function study: Secondary | ICD-10-CM | POA: Diagnosis not present

## 2017-10-04 DIAGNOSIS — R0609 Other forms of dyspnea: Secondary | ICD-10-CM | POA: Diagnosis not present

## 2017-10-04 DIAGNOSIS — R0789 Other chest pain: Secondary | ICD-10-CM | POA: Diagnosis not present

## 2017-10-04 DIAGNOSIS — R6889 Other general symptoms and signs: Secondary | ICD-10-CM | POA: Diagnosis not present

## 2017-10-10 ENCOUNTER — Encounter: Payer: Self-pay | Admitting: Gastroenterology

## 2017-10-10 ENCOUNTER — Encounter (INDEPENDENT_AMBULATORY_CARE_PROVIDER_SITE_OTHER): Payer: Self-pay

## 2017-10-10 ENCOUNTER — Ambulatory Visit (INDEPENDENT_AMBULATORY_CARE_PROVIDER_SITE_OTHER): Payer: Medicare Other | Admitting: Gastroenterology

## 2017-10-10 ENCOUNTER — Other Ambulatory Visit (INDEPENDENT_AMBULATORY_CARE_PROVIDER_SITE_OTHER): Payer: Medicare Other

## 2017-10-10 VITALS — BP 140/86 | HR 70 | Ht 71.0 in | Wt 186.0 lb

## 2017-10-10 DIAGNOSIS — D509 Iron deficiency anemia, unspecified: Secondary | ICD-10-CM

## 2017-10-10 LAB — CBC WITH DIFFERENTIAL/PLATELET
BASOS PCT: 1.3 % (ref 0.0–3.0)
Basophils Absolute: 0.1 10*3/uL (ref 0.0–0.1)
EOS PCT: 3 % (ref 0.0–5.0)
Eosinophils Absolute: 0.1 10*3/uL (ref 0.0–0.7)
HCT: 40.7 % (ref 39.0–52.0)
HEMOGLOBIN: 13 g/dL (ref 13.0–17.0)
LYMPHS ABS: 1.5 10*3/uL (ref 0.7–4.0)
Lymphocytes Relative: 33.6 % (ref 12.0–46.0)
MCHC: 32 g/dL (ref 30.0–36.0)
MCV: 73.5 fl — ABNORMAL LOW (ref 78.0–100.0)
MONO ABS: 0.6 10*3/uL (ref 0.1–1.0)
MONOS PCT: 13.4 % — AB (ref 3.0–12.0)
NEUTROS PCT: 48.7 % (ref 43.0–77.0)
Neutro Abs: 2.2 10*3/uL (ref 1.4–7.7)
Platelets: 270 10*3/uL (ref 150.0–400.0)
RBC: 5.54 Mil/uL (ref 4.22–5.81)
RDW: 24.2 % — AB (ref 11.5–15.5)
WBC: 4.4 10*3/uL (ref 4.0–10.5)

## 2017-10-10 LAB — IBC PANEL
Iron: 82 ug/dL (ref 42–165)
Saturation Ratios: 16 % — ABNORMAL LOW (ref 20.0–50.0)
Transferrin: 367 mg/dL — ABNORMAL HIGH (ref 212.0–360.0)

## 2017-10-10 LAB — FERRITIN: FERRITIN: 16.4 ng/mL — AB (ref 22.0–322.0)
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
## 2017-10-10 NOTE — Patient Instructions (Signed)
If you are age 68 or older, your body mass index should be between 23-30. Your Body mass index is 25.94 kg/m. If this is out of the aforementioned range listed, please consider follow up with your Primary Care Provider.  If you are age 48 or younger, your body mass index should be between 19-25. Your Body mass index is 25.94 kg/m. If this is out of the aformentioned range listed, please consider follow up with your Primary Care Provider.   Your provider has requested that you go to the basement level for lab work before leaving today. Press "B" on the elevator. The lab is located at the first door on the left as you exit the elevator.  Thank you for choosing [facility]  Dr Wilfrid Lund III

## 2017-10-19 ENCOUNTER — Encounter (HOSPITAL_COMMUNITY)
Admission: EM | Disposition: A | Discharge status: Home / Self Care | Payer: Self-pay | Source: Home / Self Care | Attending: Emergency Medicine

## 2017-10-19 ENCOUNTER — Observation Stay (HOSPITAL_COMMUNITY)
Admission: EM | Admit: 2017-10-19 | Discharge: 2017-10-20 | Disposition: A | Discharge status: Home / Self Care | Payer: Medicare Other | Attending: Internal Medicine | Admitting: Internal Medicine

## 2017-10-19 ENCOUNTER — Encounter (HOSPITAL_COMMUNITY): Payer: Self-pay

## 2017-10-19 ENCOUNTER — Emergency Department (HOSPITAL_COMMUNITY): Payer: Medicare Other

## 2017-10-19 ENCOUNTER — Ambulatory Visit (AMBULATORY_SURGERY_CENTER): Payer: Medicare Other | Admitting: Gastroenterology

## 2017-10-19 ENCOUNTER — Emergency Department (HOSPITAL_COMMUNITY): Payer: Medicare Other | Admitting: Anesthesiology

## 2017-10-19 ENCOUNTER — Encounter: Payer: Self-pay | Admitting: Gastroenterology

## 2017-10-19 ENCOUNTER — Other Ambulatory Visit: Payer: Self-pay

## 2017-10-19 VITALS — BP 134/93 | HR 89 | Temp 98.4°F | Resp 23 | Ht 71.0 in | Wt 186.0 lb

## 2017-10-19 DIAGNOSIS — R0989 Other specified symptoms and signs involving the circulatory and respiratory systems: Secondary | ICD-10-CM | POA: Diagnosis not present

## 2017-10-19 DIAGNOSIS — I10 Essential (primary) hypertension: Secondary | ICD-10-CM

## 2017-10-19 DIAGNOSIS — K922 Gastrointestinal hemorrhage, unspecified: Secondary | ICD-10-CM | POA: Diagnosis not present

## 2017-10-19 DIAGNOSIS — I110 Hypertensive heart disease with heart failure: Secondary | ICD-10-CM | POA: Diagnosis not present

## 2017-10-19 DIAGNOSIS — Z87891 Personal history of nicotine dependence: Secondary | ICD-10-CM | POA: Insufficient documentation

## 2017-10-19 DIAGNOSIS — R531 Weakness: Secondary | ICD-10-CM | POA: Diagnosis not present

## 2017-10-19 DIAGNOSIS — I5022 Chronic systolic (congestive) heart failure: Secondary | ICD-10-CM | POA: Diagnosis not present

## 2017-10-19 DIAGNOSIS — D500 Iron deficiency anemia secondary to blood loss (chronic): Secondary | ICD-10-CM

## 2017-10-19 DIAGNOSIS — R404 Transient alteration of awareness: Secondary | ICD-10-CM | POA: Diagnosis not present

## 2017-10-19 DIAGNOSIS — I509 Heart failure, unspecified: Secondary | ICD-10-CM | POA: Diagnosis not present

## 2017-10-19 DIAGNOSIS — D509 Iron deficiency anemia, unspecified: Secondary | ICD-10-CM | POA: Insufficient documentation

## 2017-10-19 DIAGNOSIS — Z800 Family history of malignant neoplasm of digestive organs: Secondary | ICD-10-CM | POA: Diagnosis not present

## 2017-10-19 DIAGNOSIS — K226 Gastro-esophageal laceration-hemorrhage syndrome: Secondary | ICD-10-CM | POA: Diagnosis not present

## 2017-10-19 DIAGNOSIS — Z79899 Other long term (current) drug therapy: Secondary | ICD-10-CM | POA: Insufficient documentation

## 2017-10-19 DIAGNOSIS — E785 Hyperlipidemia, unspecified: Secondary | ICD-10-CM | POA: Diagnosis not present

## 2017-10-19 DIAGNOSIS — Z7982 Long term (current) use of aspirin: Secondary | ICD-10-CM | POA: Insufficient documentation

## 2017-10-19 DIAGNOSIS — Z880 Allergy status to penicillin: Secondary | ICD-10-CM | POA: Insufficient documentation

## 2017-10-19 DIAGNOSIS — Z8679 Personal history of other diseases of the circulatory system: Secondary | ICD-10-CM

## 2017-10-19 HISTORY — PX: ESOPHAGOGASTRODUODENOSCOPY: SHX5428

## 2017-10-19 LAB — CBC WITH DIFFERENTIAL/PLATELET
BASOS PCT: 1 %
Basophils Absolute: 0 10*3/uL (ref 0.0–0.1)
EOS ABS: 0.1 10*3/uL (ref 0.0–0.7)
EOS PCT: 3 %
HCT: 42.4 % (ref 39.0–52.0)
Hemoglobin: 13.2 g/dL (ref 13.0–17.0)
LYMPHS ABS: 1.5 10*3/uL (ref 0.7–4.0)
Lymphocytes Relative: 37 %
MCH: 23.8 pg — AB (ref 26.0–34.0)
MCHC: 31.1 g/dL (ref 30.0–36.0)
MCV: 76.4 fL — ABNORMAL LOW (ref 78.0–100.0)
MONO ABS: 0.5 10*3/uL (ref 0.1–1.0)
Monocytes Relative: 11 %
NEUTROS ABS: 2 10*3/uL (ref 1.7–7.7)
Neutrophils Relative %: 48 %
Platelets: 211 10*3/uL (ref 150–400)
RBC: 5.55 MIL/uL (ref 4.22–5.81)
RDW: 19.2 % — AB (ref 11.5–15.5)
WBC: 4.1 10*3/uL (ref 4.0–10.5)

## 2017-10-19 LAB — COMPREHENSIVE METABOLIC PANEL
ALBUMIN: 3.9 g/dL (ref 3.5–5.0)
ALK PHOS: 50 U/L (ref 38–126)
ALT: 40 U/L (ref 17–63)
AST: 25 U/L (ref 15–41)
Anion gap: 11 (ref 5–15)
BUN: 17 mg/dL (ref 6–20)
CALCIUM: 9.6 mg/dL (ref 8.9–10.3)
CHLORIDE: 103 mmol/L (ref 101–111)
CO2: 25 mmol/L (ref 22–32)
CREATININE: 1 mg/dL (ref 0.61–1.24)
GFR calc Af Amer: >60 mL/min (ref >60)
GFR calc non Af Amer: >60 mL/min (ref >60)
GLUCOSE: 82 mg/dL (ref 65–99)
Potassium: 3.7 mmol/L (ref 3.5–5.1)
SODIUM: 139 mmol/L (ref 135–145)
Total Bilirubin: 0.5 mg/dL (ref 0.3–1.2)
Total Protein: 7.6 g/dL (ref 6.5–8.1)

## 2017-10-19 SURGERY — EGD (ESOPHAGOGASTRODUODENOSCOPY)
Anesthesia: Monitor Anesthesia Care

## 2017-10-19 MED ORDER — SODIUM CHLORIDE 0.9 % IV SOLN: 500.0000 mL | Freq: Once | INTRAVENOUS | Status: DC

## 2017-10-19 MED ORDER — SODIUM CHLORIDE 0.9 % IV SOLN: INTRAVENOUS | Status: DC

## 2017-10-19 MED ORDER — HYDRALAZINE HCL 20 MG/ML IJ SOLN: 10.0000 mg | Freq: Four times a day (QID) | INTRAMUSCULAR | Status: DC | PRN

## 2017-10-19 MED ORDER — PANTOPRAZOLE SODIUM 40 MG IV SOLR
40.0000 mg | Freq: Two times a day (BID) | INTRAVENOUS | Status: DC
  Administered 2017-10-19 – 2017-10-20 (×3): 40 mg via INTRAVENOUS
  Filled 2017-10-19 (×4): qty 40

## 2017-10-19 MED ORDER — ONDANSETRON HCL 4 MG/2ML IJ SOLN
INTRAMUSCULAR | Status: DC | PRN
  Administered 2017-10-19: 4 mg via INTRAVENOUS

## 2017-10-19 MED ORDER — PROPOFOL 500 MG/50ML IV EMUL
INTRAVENOUS | Status: DC | PRN
  Administered 2017-10-19: 100 ug/kg/min via INTRAVENOUS

## 2017-10-19 MED ORDER — PROPOFOL 10 MG/ML IV BOLUS
INTRAVENOUS | Status: AC
  Filled 2017-10-19: qty 40

## 2017-10-19 MED ORDER — SODIUM CHLORIDE 0.9 % IV SOLN
INTRAVENOUS | Status: DC
  Administered 2017-10-19: 1000 mL via INTRAVENOUS

## 2017-10-19 MED ORDER — STERILE WATER FOR INJECTION IJ SOLN
INTRAMUSCULAR | Status: AC
  Administered 2017-10-19: 10 mL
  Filled 2017-10-19: qty 10

## 2017-10-19 MED ORDER — PROPOFOL 10 MG/ML IV BOLUS
INTRAVENOUS | Status: DC | PRN
  Administered 2017-10-19: 40 mg via INTRAVENOUS
  Administered 2017-10-19 (×2): 20 mg via INTRAVENOUS

## 2017-10-19 MED ORDER — LACTATED RINGERS IV SOLN
INTRAVENOUS | Status: DC | PRN
  Administered 2017-10-19: 15:00:00 via INTRAVENOUS

## 2017-10-19 NOTE — Progress Notes (Signed)
Pt did not enter recovery room- transferred directly via ambulance to [facility] from procedure room 3.  Dr. Loletha Carrow called [place] personally to give report.  Wife has all valuables at D/C.  Appropriate G codes entered into computer.  Pt D/C'd at 1054

## 2017-10-19 NOTE — Anesthesia Preprocedure Evaluation (Addendum)
Anesthesia Evaluation  Patient identified by MRN, date of birth, ID band Patient awake    Reviewed: Allergy & Precautions, NPO status , Patient's Chart, lab work & pertinent test results  History of Anesthesia Complications Negative for: history of anesthetic complications  Airway Mallampati: II  TM Distance: >3 FB Neck ROM: Full    Dental  (+) Dental Advisory Given   Pulmonary former smoker (quit 2000),    breath sounds clear to auscultation       Cardiovascular hypertension, Pt. on medications (-) angina Rhythm:Regular Rate:Normal  '17 exercise stress test: left ventricular ejection fraction is moderately decreased (30-44%). LV perfusion is good.  No evidence of ischemia   Neuro/Psych    GI/Hepatic (+)     substance abuse (no use in years)  alcohol use, EGD earlier today, for second look   Endo/Other  negative endocrine ROS  Renal/GU negative Renal ROS     Musculoskeletal   Abdominal   Peds  Hematology negative hematology ROS (+)   Anesthesia Other Findings   Reproductive/Obstetrics                            Anesthesia Physical Anesthesia Plan  ASA: II  Anesthesia Plan: MAC   Post-op Pain Management:    Induction:   PONV Risk Score and Plan: 1 and Ondansetron and Treatment may vary due to age or medical condition  Airway Management Planned: Natural Airway and Nasal Cannula  Additional Equipment:   Intra-op Plan:   Post-operative Plan:   Informed Consent: I have reviewed the patients History and Physical, chart, labs and discussed the procedure including the risks, benefits and alternatives for the proposed anesthesia with the patient or authorized representative who has indicated his/her understanding and acceptance.   Dental advisory given  Plan Discussed with: CRNA and Surgeon  Anesthesia Plan Comments: (Plan routine monitors, MAC)        Anesthesia Quick  Evaluation

## 2017-10-19 NOTE — Progress Notes (Signed)
See EGD report for more details.  A coughing episode during EGD caused two Mallory Weiss tears at the [facility] with bleeding that could not be controlled with clips. Patient remained stable throughout the procedure and post-procedure period.  He had no pain complaints after waking from sedation, and was transported by ambulance to [facility] ED.  I spoke with the patient's wife and gave report to the ED physician.  I then informed Dr. Havery Moros, who is hospital doctor this week and will see the patient.

## 2017-10-19 NOTE — Op Note (Signed)
[facility] Patient Name: David Gilmore Procedure Date: 10/19/2017 MRN: [id number] Attending MD: David Gilmore. David Viruet MD, MD Date of Birth: 03-Jan-1951 CSN: [id number] Age: 67 Admit Type: Outpatient Procedure:                Upper GI endoscopy Indications:              Recent EGD with David Gilmore tear x 2 due to                            coughing during the exam, EGD to reassess and treat                            if persistent bleeding Providers:                David Gilmore. David Sow MD, MD, David Lawman, RN,                            David Gilmore, Technician Referring MD:              Medicines:                Monitored Anesthesia Care Complications:            No immediate complications. Estimated blood loss:                            Minimal. Estimated Blood Loss:     Estimated blood loss was minimal. Procedure:                Pre-Anesthesia Assessment:                           - Prior to the procedure, a History and Physical                            was performed, and patient medications and                            allergies were reviewed. The patient's tolerance of                            previous anesthesia was also reviewed. The risks                            and benefits of the procedure and the sedation                            options and risks were discussed with the patient.                            All questions were answered, and informed consent                            was obtained. Prior Anticoagulants: The patient has  taken no previous anticoagulant or antiplatelet                            agents. ASA Grade Assessment: II - A patient with                            mild systemic disease. After reviewing the risks                            and benefits, the patient was deemed in                            satisfactory condition to undergo the procedure.                           After obtaining  informed consent, the endoscope was                            passed under direct vision. Throughout the                            procedure, the patient's blood pressure, pulse, and                            oxygen saturations were monitored continuously. The                            Endoscope was introduced through the mouth, and                            advanced to the gastric cardia. The upper GI                            endoscopy was accomplished without difficulty. The                            patient tolerated the procedure well. Scope In: Scope Out: Findings:      Mallory-Weiss tears x 2 were found near the GEJ, with 2 previously       placed hemostasis clips. The tear located at the 9 clock position at       active bleeding at the proximal aspect of the tear. One hemostasis clip       was placed at the site of bleeding and to close the defect, hemostasis       was achieved. The Mallory Weiss tear located at the 3 o'clock position       had some mild oozing. One hemostasis clip was placed across the middle       of the defect which seemed to close most of it fairly well and       hemostasis was achieved. I observed the area and lavaged it for a bit       after the intervention and no further bleeding noted. The patient had       some coughing during the procedure, and in this light given hemostasis  was achieved with 2 clips placed, no further treatment was performed and       the endoscope was withdrawn in concern that further coughing could       precipitate recurrent bleeding. There was no bleeding at the end of the       procedure.      The exam of the esophagus was otherwise normal.      Red blood was found in the cardia and in the gastric fundus. Impression:               - Mallory-Weiss tear x 2 as outlined with active                            bleeding. 2 additional clips were placed as                            described, with hemostasis.                            - Red blood in the gastric fundus and in the cardia. Moderate Sedation:      N/A- Per Anesthesia Care Recommendation:           - Return patient to hospital ward for ongoing care.                           - Remain NPO tonight, clear liquids tomorrow                            morning if he does well overnight                           - Continue present medications.                           - IV protonix                           - Monitor for recurrent bleeding, trend Hgb                           - Call with questions, we will continue to follow Procedure Code(s):        --- Professional ---                           [phone], Esophagoscopy, flexible, transoral; with                            control of bleeding, any method Diagnosis Code(s):        --- Professional ---                           K22.6, Gastro-esophageal laceration-hemorrhage                            syndrome  K92.2, Gastrointestinal hemorrhage, unspecified CPT copyright 2017 American Medical Association. All rights reserved. The codes documented in this report are preliminary and upon coder review may  be revised to meet current compliance requirements. David Lipps P. Taffy Delconte MD, MD 10/19/2017 3:21:49 PM This report has been signed electronically. Number of Addenda: 0

## 2017-10-19 NOTE — ED Triage Notes (Signed)
Per EMS: Pt is from [facility] end[facility]. Pt was getting a scope done and coughed while the scope was down causing some tearing. Pt had 2 mallory weis tears. They placed clamps to stop the bleeding.  Pt is calm and alert and oriented at this time.

## 2017-10-19 NOTE — Anesthesia Postprocedure Evaluation (Signed)
Anesthesia Post Note  Patient: Viyaan Champine  Procedure(s) Performed: ESOPHAGOGASTRODUODENOSCOPY (EGD) (N/A )     Patient location during evaluation: Endoscopy Anesthesia Type: MAC Level of consciousness: awake and alert, oriented and patient cooperative Pain management: pain level controlled Vital Signs Assessment: post-procedure vital signs reviewed and stable Respiratory status: spontaneous breathing, nonlabored ventilation and respiratory function stable Cardiovascular status: blood pressure returned to baseline and stable Postop Assessment: no apparent nausea or vomiting Anesthetic complications: no    Last Vitals:  Vitals:   10/19/17 1520 10/19/17 1530  BP: 134/90 (!) 139/92  Pulse: 83 82  Resp: 15 16  Temp:    SpO2: 98% 97%    Last Pain:  Vitals:   10/19/17 1530  TempSrc:   PainSc: 0-No pain                 Glenola Wheat,E. Amahia Madonia
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
## 2017-10-19 NOTE — ED Notes (Signed)
Bed: WA21 Expected date:  Expected time:  Means of arrival:  Comments: Pt in ENDO

## 2017-10-19 NOTE — Progress Notes (Signed)
Pt txport to [facility] by EMS after procedural complication. Pt awake, alert, VSS. O2 in progress at D/C.

## 2017-10-19 NOTE — Transfer of Care (Signed)
Immediate Anesthesia Transfer of Care Note  Patient: David Gilmore  Procedure(s) Performed: Procedure(s): ESOPHAGOGASTRODUODENOSCOPY (EGD) (N/A)  Patient Location: PACU  Anesthesia Type:MAC  Level of Consciousness:  sedated, patient cooperative and responds to stimulation  Airway & Oxygen Therapy:Patient Spontanous Breathing and Patient connected to face mask oxgen  Post-op Assessment:  Report given to PACU RN and Post -op Vital signs reviewed and stable  Post vital signs:  Reviewed and stable  Last Vitals:  Vitals:   10/19/17 1349 10/19/17 1510  BP: (!) 147/92 (!) 135/91  Pulse: 93 82  Resp: 20 16  Temp: 36.4 C   SpO2: 11% 94%    Complications: No apparent anesthesia complications
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
## 2017-10-19 NOTE — ED Notes (Signed)
ED TO INPATIENT HANDOFF REPORT  Name/Age/Gender David Gilmore 67 y.o. male  Code Status    Code Status Orders  (From admission, onward)        Start     Ordered   10/19/17 1604  Full code  Continuous     10/19/17 1603    Code Status History    This patient has a current code status but no historical code status.      Home/SNF/Other Home  Chief Complaint surgical complication  Level of Care/Admitting Diagnosis ED Disposition    ED Disposition Condition [place] [facility]: [facility] [[id number]]  Level of Care: Med-Surg [16]  Diagnosis: Mallory-Weiss tear [786754]  Admitting Physician: Kayleen Memos [[id number]]  Attending Physician: Kayleen Memos [[id number]]  PT Class (Do Not Modify): Observation [104]  PT Acc Code (Do Not Modify): Observation [10022]       Medical History Past Medical History:  Diagnosis Date  . Anemia   . Hyperlipidemia    no rx  . Hypertension   . Seasonal allergies   . Substance abuse ([place])    stopped drinking ETOH in 1985    Allergies Allergies  Allergen Reactions  . Penicillins     "buzzin' in my head"    IV Location/Drains/Wounds Patient Lines/Drains/Airways Status   Active Line/Drains/Airways    Name:   Placement date:   Placement time:   Site:   Days:   Peripheral IV 10/19/17 Right Hand   10/19/17    0907    Hand   less than 1          Labs/Imaging Results for orders placed or performed during the hospital encounter of 10/19/17 (from the past 48 hour(s))  Comprehensive metabolic panel     Status: None   Collection Time: 10/19/17 12:08 PM  Result Value Ref Range   Sodium 139 135 - 145 mmol/L   Potassium 3.7 3.5 - 5.1 mmol/L   Chloride 103 101 - 111 mmol/L   CO2 25 22 - 32 mmol/L   Glucose, Bld 82 65 - 99 mg/dL   BUN 17 6 - 20 mg/dL   Creatinine, Ser 1.00 0.61 - 1.24 mg/dL   Calcium 9.6 8.9 - 10.3 mg/dL   Total Protein 7.6 6.5 - 8.1 g/dL   Albumin 3.9 3.5 - 5.0 g/dL   AST 25  15 - 41 U/L   ALT 40 17 - 63 U/L   Alkaline Phosphatase 50 38 - 126 U/L   Total Bilirubin 0.5 0.3 - 1.2 mg/dL   GFR calc non Af Amer >60 >60 mL/min   GFR calc Af Amer >60 >60 mL/min    Comment: (NOTE) The eGFR has been calculated using the CKD EPI equation. This calculation has not been validated in all clinical situations. eGFR's persistently <60 mL/min signify possible Chronic Kidney Disease.    Anion gap 11 5 - 15    Comment: Performed at [facility], [place] [address] [place], [place] [address]  CBC with Differential     Status: Abnormal   Collection Time: 10/19/17 12:08 PM  Result Value Ref Range   WBC 4.1 4.0 - 10.5 K/uL   RBC 5.55 4.22 - 5.81 MIL/uL   Hemoglobin 13.2 13.0 - 17.0 g/dL   HCT 42.4 39.0 - 52.0 %   MCV 76.4 (L) 78.0 - 100.0 fL   MCH 23.8 (L) 26.0 - 34.0 pg   MCHC 31.1 30.0 - 36.0 g/dL  RDW 19.2 (H) 11.5 - 15.5 %   Platelets 211 150 - 400 K/uL   Neutrophils Relative % 48 %   Lymphocytes Relative 37 %   Monocytes Relative 11 %   Eosinophils Relative 3 %   Basophils Relative 1 %   Neutro Abs 2.0 1.7 - 7.7 K/uL   Lymphs Abs 1.5 0.7 - 4.0 K/uL   Monocytes Absolute 0.5 0.1 - 1.0 K/uL   Eosinophils Absolute 0.1 0.0 - 0.7 K/uL   Basophils Absolute 0.0 0.0 - 0.1 K/uL    Comment: Performed at [facility], [place] [address] [place], [place] [address]   Dg Chest 2 View  Result Date: 10/19/2017 CLINICAL DATA:  Recent endoscopy with esophageal tear EXAM: CHEST - 2 VIEW COMPARISON:  08/16/2017 FINDINGS: Cardiac shadow is within normal limits. Some chronic changes in the bases bilaterally are seen. Two small clips are noted in the region of the distal esophagus consistent with the given clinical history. No pneumothorax is seen. No pneumomediastinum is noted. IMPRESSION: Mild bibasilar changes stable from previous exam. Changes consistent with endoscopy clip placement. Electronically Signed   By: Inez Catalina M.D.   On: 10/19/2017 12:11     Pending Labs Unresulted Labs (From admission, onward)   Start     Ordered   10/20/17 0500  CBC  Tomorrow morning,   R     10/19/17 1601   10/20/17 0500  Comprehensive metabolic panel  Tomorrow morning,   R     10/19/17 1601      Vitals/Pain Today's Vitals   10/19/17 1349 10/19/17 1510 10/19/17 1520 10/19/17 1530  BP: (!) 147/92 (!) 135/91 134/90 (!) 139/92  Pulse: 93 82 83 82  Resp: _0 Temp: 97.6 F (36.4 C)     TempSrc: Oral     SpO2: 99% 99% 98% 97%  Weight: 186 lb (84.4 kg)     Height: _1  (1.803 m)     PainSc: 0-No pain 0-No pain 0-No pain 0-No pain    Isolation Precautions No active isolations  Medications Medications  pantoprazole (PROTONIX) injection 40 mg ( Intravenous MAR Unhold 10/19/17 1534)  0.9 %  sodium chloride infusion (has no administration in time range)  sterile water (preservative free) injection (10 mLs  Given 10/19/17 1313)    Mobility walks
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
## 2017-10-19 NOTE — Progress Notes (Signed)
Called to room to assist during endoscopic procedure.  Patient ID and intended procedure confirmed with present staff. Received instructions for my participation in the procedure from the performing physician.  See CRNA notes for further information regarding case.

## 2017-10-19 NOTE — Op Note (Signed)
[place] Patient Name: David Gilmore Procedure Date: 10/19/2017 10:04 AM MRN: [id number] Endoscopist: Mallie Mussel L. Loletha Carrow , MD Age: 67 Referring MD:  Date of Birth: [birth year] Gender: Male Account #: [id number] Procedure:                Upper GI endoscopy Indications:              Iron deficiency anemia Medicines:                Monitored Anesthesia Care Procedure:                Pre-Anesthesia Assessment:                           - Prior to the procedure, a History and Physical                            was performed, and patient medications and                            allergies were reviewed. The patient's tolerance of                            previous anesthesia was also reviewed. The risks                            and benefits of the procedure and the sedation                            options and risks were discussed with the patient.                            All questions were answered, and informed consent                            was obtained. Anticoagulants: The patient has taken                            aspirin. It was decided not to withhold this                            medication prior to the procedure. ASA Grade                            Assessment: II - A patient with mild systemic                            disease. After reviewing the risks and benefits,                            the patient was deemed in satisfactory condition to                            undergo the procedure.  After obtaining informed consent, the endoscope was                            passed under direct vision. Throughout the                            procedure, the patient's blood pressure, pulse, and                            oxygen saturations were monitored continuously. The                            Endoscope was introduced through the mouth, and                            advanced to the second part of duodenum. The upper                             GI endoscopy was accomplished without difficulty.                            The patient tolerated the procedure. Scope In: Scope Out: Findings:                 The esophagus was normal. Technical problems                            precluded photo of the normal esophagus during                            scope insertion.                           Diffuse erythematous mucosa was found in the entire                            examined stomach. This was biopsied with a cold                            forceps for Helicobacter pylori testing using                            CLOtest.                           The cardia and gastric fundus were normal on                            retroflexion except for blood seen from esophagus.                           The examined duodenum was normal.                           Near the end of the procedure, the patient had a  coughing episode resulting in two Mallory-Weiss                            tears on opposite walls of the EGJ with significant                            bleeding. A clip was placed on each one, but                            placement was not optimal and the tears were too                            long to be completely closed with that                            intervention. Bleeding appeared to continue at the                            end of the procedure. The patient continued to                            cough, so the procedure was terminated.                           His vital signs and oxygenation remained stable                            throughout the procedure except for tachycardia,                            which resolved in the post-procedure period.                           He awoke from sedation breathing comfortably and                            without chest or neck pain, and had no crepitus on                            exam. Complications:             Hemorrhage Estimated Blood Loss:     Uncertain blood loss. Impression:               - Normal esophagus.                           - Erythematous mucosa in the stomach. Biopsied.                           - Normal examined duodenum. Recommendation:           - Transport patient to ER.                           - NPO.  Patient's wife was updated, and the [facility] ED physician David Gilmore) was notifed. David Gilmore L. Loletha Carrow, MD 10/19/2017 11:15:03 AM This report has been signed electronically.

## 2017-10-19 NOTE — ED Notes (Signed)
Bed: WA21 Expected date:  Expected time:  Means of arrival:  Comments: EMS, from endo

## 2017-10-19 NOTE — Interval H&P Note (Signed)
History and Physical Interval Note:  10/19/2017 2:43 PM  [place]  has presented today for surgery, with the diagnosis of upper GI bleeding, Mallory Weiss tears  The various methods of treatment have been discussed with the patient and family. After consideration of risks, benefits and other options for treatment, the patient has consented to  Procedure(s): ESOPHAGOGASTRODUODENOSCOPY (EGD) (N/A) as a surgical intervention .  The patient's history has been reviewed, patient examined, no change in status, stable for surgery.  I have reviewed the patient's chart and labs.  Questions were answered to the patient's satisfaction.     [place]

## 2017-10-20 ENCOUNTER — Encounter (HOSPITAL_COMMUNITY): Payer: Self-pay | Admitting: Gastroenterology

## 2017-10-20 DIAGNOSIS — K226 Gastro-esophageal laceration-hemorrhage syndrome: Secondary | ICD-10-CM | POA: Diagnosis not present

## 2017-10-20 DIAGNOSIS — I509 Heart failure, unspecified: Secondary | ICD-10-CM

## 2017-10-20 DIAGNOSIS — D500 Iron deficiency anemia secondary to blood loss (chronic): Secondary | ICD-10-CM | POA: Diagnosis not present

## 2017-10-20 DIAGNOSIS — I10 Essential (primary) hypertension: Secondary | ICD-10-CM | POA: Diagnosis not present

## 2017-10-20 DIAGNOSIS — I5022 Chronic systolic (congestive) heart failure: Secondary | ICD-10-CM | POA: Diagnosis not present

## 2017-10-20 LAB — COMPREHENSIVE METABOLIC PANEL
ALT: 32 U/L (ref 17–63)
AST: 21 U/L (ref 15–41)
Albumin: 3.5 g/dL (ref 3.5–5.0)
Alkaline Phosphatase: 45 U/L (ref 38–126)
Anion gap: 8 (ref 5–15)
BUN: 14 mg/dL (ref 6–20)
CHLORIDE: 105 mmol/L (ref 101–111)
CO2: 25 mmol/L (ref 22–32)
CREATININE: 1.08 mg/dL (ref 0.61–1.24)
Calcium: 9.5 mg/dL (ref 8.9–10.3)
GFR calc Af Amer: >60 mL/min (ref >60)
Glucose, Bld: 76 mg/dL (ref 65–99)
Potassium: 4 mmol/L (ref 3.5–5.1)
Sodium: 138 mmol/L (ref 135–145)
Total Bilirubin: 1.1 mg/dL (ref 0.3–1.2)
Total Protein: 6.7 g/dL (ref 6.5–8.1)

## 2017-10-20 LAB — CBC
HCT: 39 % (ref 39.0–52.0)
HEMOGLOBIN: 12 g/dL — AB (ref 13.0–17.0)
MCH: 23.5 pg — ABNORMAL LOW (ref 26.0–34.0)
MCHC: 30.8 g/dL (ref 30.0–36.0)
MCV: 76.3 fL — AB (ref 78.0–100.0)
PLATELETS: 223 10*3/uL (ref 150–400)
RBC: 5.11 MIL/uL (ref 4.22–5.81)
RDW: 19.4 % — ABNORMAL HIGH (ref 11.5–15.5)
WBC: 5.5 10*3/uL (ref 4.0–10.5)

## 2017-10-20 MED ORDER — ASPIRIN 81 MG PO TABS: 81.0000 mg | ORAL_TABLET | Freq: Every day | ORAL | 0 refills | Status: DC

## 2017-10-20 MED ORDER — OMEPRAZOLE 20 MG PO CPDR: 20.0000 mg | DELAYED_RELEASE_CAPSULE | Freq: Two times a day (BID) | ORAL | 0 refills | Status: DC

## 2017-10-20 NOTE — Discharge Summary (Signed)
Discharge Summary  David Gilmore OIZ:[id number] DOB: 03/07/1951  PCP: [name], MD  Admit date: 10/19/2017 Discharge date: 10/20/2017  Time spent: <14mins  Recommendations for Outpatient Follow-up:  1. F/u with PMD within a week  for hospital discharge follow up, repeat cbc/bmp at follow up 2. F/u with GI for biopsy result and diet advancement. Hold asa for a few days,gi will decided on resuming asa next week   Discharge Diagnoses:  Active Hospital Problems   Diagnosis Date Noted  . Mallory-Weiss tear   . Chronic HFrEF (heart failure with reduced ejection fraction) [facility])     Resolved Hospital Problems  No resolved problems to display.    Discharge Condition: stable  Diet recommendation: stay on full liquid over the weekend, then gi will direct patient to advance to soft diet  Filed Weights   10/19/17 1349  Weight: 84.4 kg (186 lb)    History of present illness: (per Admitting MD Dr Nevada Crane) PCP: [name], MD  Outpatient Specialists: GI [place] Patient coming from: Home Chief Complaint: Esophageal tear  HPI: David Gilmore is a 68 y.o. male with medical history significant for hypertension, hyperlipidemia, iron deficiency anemia a month ago, who presented to his GI physician for an outpatient endoscopy.  During the procedure the patient coughed and that led to Mallory-Weiss tears with hemostasis clips placed.  GI recommended the patient to go to [facility] ED to have an endoscopy repeated to assure that the bleeding had stopped.  Repeated EGD was done and additional clips were placed today in the endoscopy suite at [facility].  TRH was asked to admit for observation overnight. Denies any chest pain, palpitations, or dyspnea.  ED Course: EGD done with additional hemostasis clips placed for Mallory-Weiss tears.  Accelerated hypertension which may be secondary to pain.  Hemoglobin stable at 13.2.  Chest x-ray done in the ED 10/19/2017 personally reviewed revealed  mild bibasilar atelectasis.  No lobular infiltrates.    Hospital Course:  Active Problems:   Mallory-Weiss tear   Chronic HFrEF (heart failure with reduced ejection fraction) ([facility])  Upper GI bleeding / Mallory Weiss tear x 2 sustained when patient coughed during first EGD on 4/18 yesterday.  No perforation on CXR.  Repeat EGD on 4/18  with placement of additional clips to tears.  No melena or hematemesis. No chest pain or abdominal pain. Hgb fell ~ 1 gram with bleeding and hydration.  - I have discussed case with GI Dr Havery Moros who recommended start clears and advance to fulls if tolerates. Stay on full liquids over weekend.  -Omeprazole 20mg  BID x 2 weeks per gi recommendation.  -hold asa for a few day, gi will decide when to resume asa. -GI office will call him Monday with a follow up appt and for diet advancement.     Iron deficiency anemia.  He was referred to gi for egd which is scheduled on 4/18 due to iron deficiency anemia  EGD >>> erythema / congestion of gastric mucosa on EGD, Biopsies pending.  -continue home iron  Chronic systolic heart failure with LVEF 45-50% Last echo 06/23/2015 with diffuse hypokinesis and LVEF 45-50% Not in acute exacerbation, euvolemic, no chest pain, no edema, no sob. Continue home meds F/u with pmd.  Hypertension Stable on home meds  Hyperlipidemia Continue statin    Procedures:  EGD x2 on 4/18  Consultations:  LBGI  Discharge Exam: BP 127/83 (BP Location: Left Arm)   Pulse 92   Temp 98.5 F (36.9  C) (Oral)   Resp 18   Ht 5\' 11"  (1.803 m)   Wt 84.4 kg (186 lb)   SpO2 96%   BMI 25.94 kg/m   General: NAD Cardiovascular: RRR Respiratory: CTABL  Discharge Instructions You were cared for by a hospitalist during your hospital stay. If you have any questions about your discharge medications or the care you received while you were in the hospital after you are discharged, you can call the unit and asked to speak with the  hospitalist on call if the hospitalist that took care of you is not available. Once you are discharged, your primary care physician will handle any further medical issues. Please note that NO REFILLS for any discharge medications will be authorized once you are discharged, as it is imperative that you return to your primary care physician (or establish a relationship with a primary care physician if you do not have one) for your aftercare needs so that they can reassess your need for medications and monitor your lab values.  Discharge Instructions    Diet general   Complete by:  As directed    Stay on full liquid diet over the weekend. Advance to soft diet once GI oks early next week.   Increase activity slowly   Complete by:  As directed      Allergies as of 10/20/2017      Reactions   Penicillins    "buzzin' in my head"      Medication List    TAKE these medications   amLODipine 5 MG tablet Commonly known as:  NORVASC Take 1 tablet (5 mg total) by mouth daily.   aspirin 81 MG tablet Take 1 tablet (81 mg total) by mouth daily. Hold for the next few days, resume once GI oks next week. What changed:  additional instructions   CENTRUM SILVER ADULT 50+ Tabs Take by mouth.   diltiazem 120 MG 24 hr capsule Commonly known as:  TIAZAC Take 120 mg by mouth daily.   ferrous sulfate 325 (65 FE) MG EC tablet Take 325 mg by mouth 2 (two) times daily between meals.   hydrochlorothiazide 25 MG tablet Commonly known as:  HYDRODIURIL Take 1 tablet (25 mg total) by mouth daily.   omeprazole 20 MG capsule Commonly known as:  PRILOSEC Take 1 capsule (20 mg total) by mouth 2 (two) times daily for 14 days.   rosuvastatin 20 MG tablet Commonly known as:  CRESTOR Take 1 tablet (20 mg total) by mouth daily.      Allergies  Allergen Reactions  . Penicillins     "buzzin' in my head"   Follow-up Information    [name], MD Follow up in 1 week(s).   Specialty:  Family  Medicine Why:  hospital discharge follow up, repeat cbc/bmp at follow up. please check your blood pressure at home , bring in record for your doctor to review. Contact information: [place] [address] [phone]        Doran Stabler, MD Follow up in 1 week(s).   Specialty:  Gastroenterology Why:  for biopsy result, diet advancement instructions and timing to resume asa. Contact information: [place] [address] [phone]            The results of significant diagnostics from this hospitalization (including imaging, microbiology, ancillary and laboratory) are listed below for reference.    Significant Diagnostic Studies: Dg Chest 2 View  Result Date:  10/19/2017 CLINICAL DATA:  Recent endoscopy with esophageal tear EXAM: CHEST - 2 VIEW COMPARISON:  08/16/2017 FINDINGS: Cardiac shadow is within normal limits. Some chronic changes in the bases bilaterally are seen. Two small clips are noted in the region of the distal esophagus consistent with the given clinical history. No pneumothorax is seen. No pneumomediastinum is noted. IMPRESSION: Mild bibasilar changes stable from previous exam. Changes consistent with endoscopy clip placement. Electronically Signed   By: Inez Catalina M.D.   On: 10/19/2017 12:11    Microbiology: No results found for this or any previous visit (from the past 240 hour(s)).   Labs: Basic Metabolic Panel: Recent Labs  Lab 10/19/17 1208 10/20/17 0459  NA 139 138  K 3.7 4.0  CL 103 105  CO2 25 25  GLUCOSE 82 76  BUN 17 14  CREATININE 1.00 1.08  CALCIUM 9.6 9.5   Liver Function Tests: Recent Labs  Lab 10/19/17 1208 10/20/17 0459  AST 25 21  ALT 40 32  ALKPHOS 50 45  BILITOT 0.5 1.1  PROT 7.6 6.7  ALBUMIN 3.9 3.5   No results for input(s): LIPASE, AMYLASE in the last 168 hours. No results for input(s): AMMONIA in the last 168 hours. CBC: Recent Labs  Lab 10/19/17 1208  10/20/17 0459  WBC 4.1 5.5  NEUTROABS 2.0  --   HGB 13.2 12.0*  HCT 42.4 39.0  MCV 76.4* 76.3*  PLT 211 223   Cardiac Enzymes: No results for input(s): CKTOTAL, CKMB, CKMBINDEX, TROPONINI in the last 168 hours. BNP: BNP (last 3 results) No results for input(s): BNP in the last 8760 hours.  ProBNP (last 3 results) No results for input(s): PROBNP in the last 8760 hours.  CBG: No results for input(s): GLUCAP in the last 168 hours.     Signed:  Florencia Reasons MD, PhD  [facility] 10/20/2017, 12:36 PM

## 2017-10-20 NOTE — Progress Notes (Signed)
PT Cancellation Note  Patient Details Name: David Gilmore MRN: [id number] DOB: 1951/06/13   Cancelled Treatment:    Reason Eval/Treat Not Completed: PT screened, no needs identified, will sign off. Please re-order if patient's status changes.   [name] 10/20/2017, 8:47 AM

## 2017-10-20 NOTE — Progress Notes (Addendum)
     [facility] Progress Note   Chief Complaint:   Esophageal tears  SUBJECTIVE:    feels okay. No overt bleeding, no vomiting, no abdominal pain.    ASSESSMENT AND PLAN:   1. Upper GI bleeding / Mallory Weiss tear x 2 sustained when patient coughed during EGD yesterday. No perforation on CXR. Repeat EGD yesterday with placement of additional clips to tears. No melena or hematemesis. No chest pain. Hgb fell ~ 1 gram with bleeding.  -patient stable from GI standpoint to be discharged today. Updated wife on phone.  -start clears and advance to fulls if tolerates. Stay on full liquids over weekend.  -Omeprazole 20mg  BID x 2 weeks then can stop (doesn't take anything for GERD at home). -our office will call him Monday with a follow up appt.     2. Iron deficiency anemia. EGD >>> erythema / congestion of gastric mucosa on EGD, exam o/w negative. Biopsies pending. Will contact him with results.  -continue home iron   OBJECTIVE:     Vital signs in last 24 hours: Temp:  [97.6 F (36.4 C)-98.5 F (36.9 C)] 98.5 F (36.9 C) (04/19 0457) Pulse Rate:  [74-93] 92 (04/19 0457) Resp:  [15-20] 18 (04/19 0457) BP: (127-156)/(79-94) 127/83 (04/19 0457) SpO2:  [96 %-100 %] 96 % (04/19 0457) Weight:  [186 lb (84.4 kg)] 186 lb (84.4 kg) (04/18 1349) Last BM Date: 10/19/17 General:   Alert, well-developed, black male in NAD EENT:  Normal hearing, non icteric sclera, conjunctive pink.  Heart:  Regular rate and rhythm; no murmurs. No lower extremity edema Pulm: Normal respiratory effort, lungs CTA bilaterally without wheezes or crackles. Abdomen:  Soft, nondistended, nontender.  Normal bowel sounds, no masses felt. No hepatomegaly.    Neurologic:  Alert and  oriented x4;  grossly normal neurologically. Psych:  Pleasant, cooperative.  Normal mood and affect.   Intake/Output from previous day: 04/18 0701 - 04/19 0700 In: 1000 [I.V.:1000] Out: 5 [Blood:5] Intake/Output this  shift: No intake/output data recorded.  Lab Results: Recent Labs    10/19/17 1208 10/20/17 0459  WBC 4.1 5.5  HGB 13.2 12.0*  HCT 42.4 39.0  PLT 211 223   BMET Recent Labs    10/19/17 1208 10/20/17 0459  NA 139 138  K 3.7 4.0  CL 103 105  CO2 25 25  GLUCOSE 82 76  BUN 17 14  CREATININE 1.00 1.08  CALCIUM 9.6 9.5   LFT Recent Labs    10/20/17 0459  PROT 6.7  ALBUMIN 3.5  AST 21  ALT 32  ALKPHOS 45  BILITOT 1.1    Dg Chest 2 View  Result Date: 10/19/2017 CLINICAL DATA:  Recent endoscopy with esophageal tear EXAM: CHEST - 2 VIEW COMPARISON:  08/16/2017 FINDINGS: Cardiac shadow is within normal limits. Some chronic changes in the bases bilaterally are seen. Two small clips are noted in the region of the distal esophagus consistent with the given clinical history. No pneumothorax is seen. No pneumomediastinum is noted. IMPRESSION: Mild bibasilar changes stable from previous exam. Changes consistent with endoscopy clip placement. Electronically Signed   By: Inez Catalina M.D.   On: 10/19/2017 12:11    Active Problems:   Mallory-Weiss tear   Chronic HFrEF (heart failure with reduced ejection fraction) ([place])    LOS: 0 days   Tye Savoy ,NP 10/20/2017, 10:35 AM  Pager number [phone]

## 2017-10-20 NOTE — Progress Notes (Signed)
Pt alert and oriented.  Tolerating clear liquids with no complaints.  D/C instructions given as well as information on a full liquid diet.  All questions answered.

## 2017-10-23 ENCOUNTER — Telehealth: Payer: Self-pay | Admitting: *Deleted

## 2017-10-23 ENCOUNTER — Telehealth: Payer: Self-pay

## 2017-10-23 LAB — HELICOBACTER PYLORI SCREEN-BIOPSY: UREASE: POSITIVE — AB

## 2017-10-23 MED ORDER — DOXYCYCLINE HYCLATE 100 MG PO TABS: 100.0000 mg | ORAL_TABLET | Freq: Two times a day (BID) | ORAL | 0 refills | Status: AC

## 2017-10-23 MED ORDER — METRONIDAZOLE 500 MG PO TABS: 500.0000 mg | ORAL_TABLET | Freq: Three times a day (TID) | ORAL | 0 refills | Status: DC

## 2017-10-23 MED ORDER — BISMUTH SUBSALICYLATE 262 MG PO TABS: 2.0000 | ORAL_TABLET | Freq: Four times a day (QID) | ORAL | 0 refills | Status: AC

## 2017-10-23 NOTE — Telephone Encounter (Signed)
David Gilmore, the patient states he is doing well. He has travel plans and is leaving Wednesday. Please advise to his follow up.

## 2017-10-23 NOTE — Telephone Encounter (Signed)
Spoke with the patient. He states he feels well and wants to know when he can have some solid food. Denies any vomiting, bleeding or chest pain. Discussed soft diet with plans to advance to a regular diet in 3 days.  Call prn vomiting, bleeding or chest pain.

## 2017-10-23 NOTE — Telephone Encounter (Signed)
Unable to reach patient telephone number not working.

## 2017-10-23 NOTE — Telephone Encounter (Signed)
  Follow up Call-  Call back number 10/19/2017 11/25/2015  Post procedure Call Back phone  # [phone]  Permission to leave phone message Yes Yes  Some recent data might be hidden     Patient questions:  Do you have a fever, pain , or abdominal swelling? No. Pain Score  0 *  Have you tolerated food without any problems? Yes.    Have you been able to return to your normal activities? Yes.    Do you have any questions about your discharge instructions: Diet   No. Medications  No. Follow up visit  No.  Do you have questions or concerns about your Care? No.  Actions: * If pain score is 4 or above: No action needed, pain <4.

## 2017-10-23 NOTE — Telephone Encounter (Signed)
Patient is advised. I have spoken with his spouse as well. Details about the treatment for H Pylori given. In agreement with this plan of care. Appointment for follow up scheduled. Will need to remind him of the retesting for H Pylori.

## 2017-10-23 NOTE — Telephone Encounter (Signed)
Thanks [employer], He can go on a soft diet for a few days, and then regular.  Of note he tested positive for H pylori on Dr. Loletha Carrow' exam and needs therapy for this. In light of his penicillin allergy I'm recommending the following for 2 weeks: - omeprazole 20mg  BID - doxycyline 100mg  BID - flagyl 500mg  TID - bismuth 300mg  four times daily  He will need H pylori eradication testing once this is done. About 1 month after he has completed therapy, he should submit an H pylori stool specimen. He will need to hold omeprazole at least 2 weeks prior to submitting this.   It's possible the H pylori is contributing to iron deficiency.   He can follow up with Dr. Loletha Carrow after results of H pylori eradication testing. Thanks

## 2017-10-23 NOTE — Telephone Encounter (Signed)
Noted thanks °

## 2017-10-23 NOTE — Telephone Encounter (Signed)
Per chart review: Admit date: 10/19/2017 Discharge date: 10/20/2017  Time spent: <102mins  Recommendations for Outpatient Follow-up:  1. F/u with PMD within a week  for hospital discharge follow up, repeat cbc/bmp at follow up 2. F/u with GI for biopsy result and diet advancement. Hold asa for a few days,gi will decided on resuming asa next week   Discharge Diagnoses:      Active Hospital Problems   Diagnosis Date Noted  . Mallory-Weiss tear   . Chronic HFrEF (heart failure with reduced ejection fraction) [facility])     Resolved Hospital Problems  No resolved problems to display.    Discharge Condition: stable  Diet recommendation: stay on full liquid over the weekend, then gi will direct patient to advance to soft diet __________________________________________________________________________________________ Per telephone call: Transition Care Management Follow-up Telephone Call   Date discharged? 10/20/17   How have you been since you were released from the hospital? "doing good"   Do you understand why you were in the hospital? yes   Do you understand the discharge instructions? yes   Where were you discharged to? Home   Items Reviewed:  Medications reviewed: yes  Allergies reviewed: yes  Dietary changes reviewed: yes. Patients wife states that they spoke to GI today and he was advanced to a soft diet. I reviewed sof diet instructions with her. All questions answered. I advised her to call the office immediately if he experiences any changes.  Referrals reviewed: yes   Functional Questionnaire:   Activities of Daily Living (ADLs):   He states they are independent in the following: ambulation, bathing and hygiene, feeding, continence, grooming, toileting and dressing States they require assistance with the following: none   Any transportation issues/concerns?: no   Any patient concerns? no   Confirmed importance and date/time of follow-up  visits scheduled yes  Provider Appointment booked with Dr Yong Channel 11/01/17 11:30  Confirmed with patient if condition begins to worsen call PCP or go to the ER.  Patient was given the office number and encouraged to call back with question or concerns.  : yes

## 2017-10-23 NOTE — Telephone Encounter (Signed)
Left voicemail requesting call back to complete hospital follow up call. 

## 2017-10-26 NOTE — Telephone Encounter (Signed)
Patient states he is wanting to speak with Beth about the soft diet he should be following. Best call back [phone].

## 2017-10-26 NOTE — Telephone Encounter (Signed)
Spouse asking if the diet can progress. Advised he can slowly advance to a regular diet. She will offer soft meats such as baked or boiled chicken, baked fish and cook vegetables. She will offer more coarse foods next week. She states she would prefer to be cautious.

## 2017-10-27 ENCOUNTER — Other Ambulatory Visit: Payer: Self-pay

## 2017-10-27 DIAGNOSIS — K226 Gastro-esophageal laceration-hemorrhage syndrome: Secondary | ICD-10-CM

## 2017-11-01 ENCOUNTER — Ambulatory Visit (INDEPENDENT_AMBULATORY_CARE_PROVIDER_SITE_OTHER): Payer: Medicare Other | Admitting: Family Medicine

## 2017-11-01 ENCOUNTER — Encounter: Payer: Self-pay | Admitting: Family Medicine

## 2017-11-01 VITALS — BP 138/92 | HR 88 | Temp 98.4°F | Ht 71.0 in | Wt 178.4 lb

## 2017-11-01 DIAGNOSIS — I5022 Chronic systolic (congestive) heart failure: Secondary | ICD-10-CM | POA: Diagnosis not present

## 2017-11-01 DIAGNOSIS — D649 Anemia, unspecified: Secondary | ICD-10-CM

## 2017-11-01 DIAGNOSIS — K226 Gastro-esophageal laceration-hemorrhage syndrome: Secondary | ICD-10-CM

## 2017-11-01 DIAGNOSIS — I10 Essential (primary) hypertension: Secondary | ICD-10-CM | POA: Diagnosis not present

## 2017-11-01 DIAGNOSIS — E785 Hyperlipidemia, unspecified: Secondary | ICD-10-CM | POA: Diagnosis not present

## 2017-11-01 LAB — CBC
HEMATOCRIT: 43.5 % (ref 39.0–52.0)
Hemoglobin: 14 g/dL (ref 13.0–17.0)
MCHC: 32.3 g/dL (ref 30.0–36.0)
MCV: 76 fl — AB (ref 78.0–100.0)
Platelets: 255 10*3/uL (ref 150.0–400.0)
RBC: 5.72 Mil/uL (ref 4.22–5.81)
RDW: 20.7 % — ABNORMAL HIGH (ref 11.5–15.5)
WBC: 4.3 10*3/uL (ref 4.0–10.5)

## 2017-11-01 LAB — BASIC METABOLIC PANEL
BUN: 19 mg/dL (ref 6–23)
CALCIUM: 10.1 mg/dL (ref 8.4–10.5)
CO2: 31 mEq/L (ref 19–32)
Chloride: 97 mEq/L (ref 96–112)
Creatinine, Ser: 1.28 mg/dL (ref 0.40–1.50)
GFR: 71.87 mL/min (ref >60.00)
GLUCOSE: 79 mg/dL (ref 70–99)
Potassium: 4.1 mEq/L (ref 3.5–5.1)
SODIUM: 135 meq/L (ref 135–145)

## 2017-11-01 NOTE — Patient Instructions (Addendum)
Health Maintenance Due  Topic Date Due  . TETANUS/TDAP - can get at your pharmacy if you decide. Need to get this definitely if you get a cut or scrape with bloodwork 10/17/1969   Start diltiazem back  Follow up with GI on the 20th  Please stop by lab before you go

## 2017-11-01 NOTE — Assessment & Plan Note (Signed)
Mild poorly controlled on Amlodipine 5 mg, HCTZ 25 mg.  advised him to restart diltiazem 120 mg extended release from Dr. Einar Gip.

## 2017-11-01 NOTE — Assessment & Plan Note (Signed)
Related to coughing during EGD-treated with hemostatic clips with resolution of bleeding.  We will update a CBC to make sure hemoglobin has remained stable.

## 2017-11-01 NOTE — Assessment & Plan Note (Addendum)
May have been caused by H. pylori and ulceration.  Patient is currently on treatment for H. Pylori with antibiotics and Prilosec.  He has planned follow-up with GI.  Iron will be continued about a month.  He finishes up Prilosec in 2 more days. Urea breath test planned after off omeprazole 5 days. When he is off all treatment for a month I would like to repeat CBC as well. Update cbc.  patient has had resolution of shortness of breath and fatigue since being on iron

## 2017-11-01 NOTE — Assessment & Plan Note (Signed)
Minimal reduction of EF of 45 to 50%.  He is not on a diuretic other than hydrochlorothiazide for blood pressure.  No signs of fluid overload

## 2017-11-01 NOTE — Progress Notes (Signed)
Subjective:  David Gilmore is a 67 y.o. year old very pleasant male patient who presents for transitional care management and hospital follow up for Mallory-Weiss tear. Patient was hospitalized from October 19, 2017 to October 20, 2017. A TCM phone call was completed on October 23, 2017. Medical complexity moderate  David Gilmore is a 67 year old male who presented to his GI physician for outpatient endoscopy- he coughed during the procedure which led to Mallory-Weiss tears and requirement of hemostasis clips being placed.  He was transferred to [facility] long emergency room to have endoscopy repeated and to ensure that the bleeding had stopped.  He had his EGD repeated at the hospital and additional clips had to be placed in the endoscopy suite.  He was admitted overnight.  Luckily he was chest pain, palpitation, shortness of breath free throughout his hospitalization.  In regards to the Mallory-Weiss tears- stabilized with hemostasis clips.  His hemoglobin was noted at 13.2 initially but later fell by about 1 g due to bleeding and hydration down to 12.0..  He had a follow-up chest x-ray to ensure no perforation.  On October 19, 2017 which showed only mild bibasilar atelectasis.  I personally reviewed this film-my findings-No acute cardiopulmonary disease with surgical clips noted with no signs of perforation.  During hospitalization patient was placed on omeprazole 20 mg twice a day and advised to do this for 2 weeks.  He was advised to hold aspirin until GI told him to resume-he has follow-up with them on May 20.  He was placed on a clear diet to start and slowly advance- he is on soft diet and minimal meat- boiled chicken.  he was also placed on doxycycline.  And metronidazole for H. Pylori.    His original EGD was planned due to iron deficiency anemia.  He did have some erythema and congestion of gastric mucosa on EGD-thought potentially H. pylori related.  He had some biopsies taken.  He was continued on home  iron at time of discharge.  Iron was planned at least 1 more month past discharge.    For his chronic systolic heart failure with ejection fraction of 45 to 50%-there were no signs of exacerbation throughout the hospitalization.  He was euvolemic.  He was continued on his home medications.  His hypertension was controlled on home medications which include amlodipine 5 mg and diltiazem 120 mg daily and hydrochlorothiazide 25 mg.  For some reason he has not been taking the diltiazem and he wanted to ask me directly about this.  We discussed restarting this today.  His hyperlipidemia was maintained on his home statin-rosuvastatin 20 mg   See problem oriented charting as well ROS- he states his fatigue, shortness of breath have resolved. No edema. No cough. No chest pain. No dizziness.    Past Medical History-  Patient Active Problem List   Diagnosis Date Noted  . Mallory-Weiss tear     Priority: High  . Anemia 08/23/2017    Priority: High  . Chronic HFrEF (heart failure with reduced ejection fraction) ([facility])     Priority: Medium  . BPH associated with nocturia 08/16/2017    Priority: Medium  . Shortness of breath 08/16/2017    Priority: Medium  . Essential hypertension 06/22/2015    Priority: Medium  . Hyperlipidemia     Priority: Medium  . Aortic atherosclerosis ([place]) 08/16/2017    Priority: Low  . Abnormal stress test 07/27/2015    Priority: Low  . Former smoker 06/15/2015  Priority: Low    Medications- reviewed and updated  A medical reconciliation was performed comparing current medicines to hospital discharge medications. Current Outpatient Medications  Medication Sig Dispense Refill  . amLODipine (NORVASC) 5 MG tablet Take 1 tablet (5 mg total) by mouth daily. 90 tablet 3  . aspirin 81 MG tablet Take 1 tablet (81 mg total) by mouth daily. Hold for the next few days, resume once GI oks next week. 30 tablet 0  . Bismuth Subsalicylate 250 MG TABS Take 2 tablets (524 mg  total) by mouth 4 (four) times daily for 14 days. 112 tablet 0  . diltiazem (TIAZAC) 120 MG 24 hr capsule Take 120 mg by mouth daily.  1  . doxycycline (VIBRA-TABS) 100 MG tablet Take 1 tablet (100 mg total) by mouth 2 (two) times daily for 14 days. 28 tablet 0  . ferrous sulfate 325 (65 FE) MG EC tablet Take 325 mg by mouth 2 (two) times daily between meals.    . hydrochlorothiazide (HYDRODIURIL) 25 MG tablet Take 1 tablet (25 mg total) by mouth daily. 90 tablet 3  . metroNIDAZOLE (FLAGYL) 500 MG tablet Take 1 tablet (500 mg total) by mouth 3 (three) times daily. 30 tablet 0  . Multiple Vitamins-Minerals (CENTRUM SILVER ADULT 50+) TABS Take by mouth.    Marland Kitchen omeprazole (PRILOSEC) 20 MG capsule Take 1 capsule (20 mg total) by mouth 2 (two) times daily for 14 days. 28 capsule 0  . rosuvastatin (CRESTOR) 20 MG tablet Take 1 tablet (20 mg total) by mouth daily. 90 tablet 3   No current facility-administered medications for this visit.     Objective: BP (!) 138/92 (BP Location: Left Arm, Patient Position: Sitting, Cuff Size: Large)   Pulse 88   Temp 98.4 F (36.9 C) (Oral)   Ht 5\' 11"  (1.803 m)   Wt 178 lb 6.4 oz (80.9 kg)   SpO2 98%   BMI 24.88 kg/m  Gen: NAD, resting comfortably No mucus membrane pallor CV: RRR no murmurs rubs or gallops Lungs: CTAB no crackles, wheeze, rhonchi Abdomen: soft/nontender/nondistended/normal bowel sounds.  Ext: no edema Skin: warm, dry Neuro: normal gait and speech  Assessment/Plan:  Mallory-Weiss tear Related to coughing during EGD-treated with hemostatic clips with resolution of bleeding.  We will update a CBC to make sure hemoglobin has remained stable.  Anemia May have been caused by H. pylori and ulceration.  Patient is currently on treatment for H. Pylori with antibiotics and Prilosec.  He has planned follow-up with GI.  Iron will be continued about a month.  He finishes up Prilosec in 2 more days. Urea breath test planned after off omeprazole 5  days. When he is off all treatment for a month I would like to repeat CBC as well. Update cbc.  patient has had resolution of shortness of breath and fatigue since being on iron   Essential hypertension Mild poorly controlled on Amlodipine 5 mg, HCTZ 25 mg.  advised him to restart diltiazem 120 mg extended release from Dr. Einar Gip.  Hyperlipidemia Continue rosuvastatin 20 mg  Chronic HFrEF (heart failure with reduced ejection fraction) ([facility]) Minimal reduction of EF of 45 to 50%.  He is not on a diuretic other than hydrochlorothiazide for blood pressure.  No signs of fluid overload   Future Appointments  Date Time Provider Gisela  11/20/2017  3:45 PM Danis, Kirke Corin, MD [facility]   We did not schedule planned follow up- 6 months would be reasonable  Lab/Order associations: Anemia, unspecified type - Plan: CBC, Iron, TIBC and Ferritin Panel  Essential hypertension - Plan: CBC, Basic metabolic panel  Return precautions advised.  [name], MD

## 2017-11-01 NOTE — Assessment & Plan Note (Signed)
Continue rosuvastatin 20 mg. 

## 2017-11-02 ENCOUNTER — Telehealth: Payer: Self-pay

## 2017-11-02 DIAGNOSIS — R9439 Abnormal result of other cardiovascular function study: Secondary | ICD-10-CM | POA: Diagnosis not present

## 2017-11-02 DIAGNOSIS — I10 Essential (primary) hypertension: Secondary | ICD-10-CM | POA: Diagnosis not present

## 2017-11-02 DIAGNOSIS — R0609 Other forms of dyspnea: Secondary | ICD-10-CM | POA: Diagnosis not present

## 2017-11-02 DIAGNOSIS — Z87891 Personal history of nicotine dependence: Secondary | ICD-10-CM | POA: Diagnosis not present

## 2017-11-02 LAB — IRON,TIBC AND FERRITIN PANEL
%SAT: 20 % (calc) (ref 15–60)
Ferritin: 23 ng/mL (ref 20–380)
Iron: 76 ug/dL (ref 50–180)
TIBC: 380 mcg/dL (calc) (ref 250–425)

## 2017-11-02 NOTE — Telephone Encounter (Signed)
Called patient and gave his lab results to him. No follow up questions. Patient verbalized understanding.

## 2017-11-03 ENCOUNTER — Telehealth: Payer: Self-pay | Admitting: Gastroenterology

## 2017-11-03 NOTE — Telephone Encounter (Signed)
Patient wife states she has questions regarding pts diet and would like to speak with nurse Beth.

## 2017-11-03 NOTE — Telephone Encounter (Signed)
Spoke to patient's wife, she wanted to know if he could have watermelon. He has been tolerating boiled chicken and cooked vegetables well. Told her that he could try very small bites, chewing well. Watermelon dissolves very easily and he should tolerate this okay. If he doesn't, then to please stop eating it for now.

## 2017-11-09 ENCOUNTER — Other Ambulatory Visit: Payer: Self-pay

## 2017-11-09 ENCOUNTER — Telehealth: Payer: Self-pay | Admitting: Gastroenterology

## 2017-11-09 NOTE — Telephone Encounter (Signed)
Spoke to patient, he finished his antibiotics on 11/07/17. Dr. Loletha Carrow would like patient to wait 2 weeks from finishing date and hold PPI 5 days before getting a urea breath test. Patient has appointment here on 11/20/17, will give written order and [facility] address to Vivien Rota so she can pass this onto patient at his appointment. He will need to take the order to [facility] to complete this test.

## 2017-11-20 ENCOUNTER — Ambulatory Visit (INDEPENDENT_AMBULATORY_CARE_PROVIDER_SITE_OTHER): Payer: Medicare Other | Admitting: Gastroenterology

## 2017-11-20 ENCOUNTER — Ambulatory Visit
Admission: RE | Admit: 2017-11-20 | Discharge: 2017-11-20 | Disposition: A | Discharge status: Home / Self Care | Payer: Medicare Other | Source: Ambulatory Visit | Attending: Gastroenterology | Admitting: Gastroenterology

## 2017-11-20 ENCOUNTER — Encounter: Payer: Self-pay | Admitting: Gastroenterology

## 2017-11-20 VITALS — BP 106/60 | HR 68 | Ht 71.0 in | Wt 177.0 lb

## 2017-11-20 DIAGNOSIS — K226 Gastro-esophageal laceration-hemorrhage syndrome: Secondary | ICD-10-CM

## 2017-11-20 DIAGNOSIS — A048 Other specified bacterial intestinal infections: Secondary | ICD-10-CM | POA: Diagnosis not present

## 2017-11-20 DIAGNOSIS — D509 Iron deficiency anemia, unspecified: Secondary | ICD-10-CM

## 2017-11-20 NOTE — Progress Notes (Signed)
[facility] Progress Note  Chief Complaint: Iron deficiency anemia and Mallory-Weiss tear  Subjective  History:  This is a follow-up for a 67 year old man I recently saw for iron deficiency anemia.  During outpatient upper endoscopy he had a brief coughing episode and developed a severe Mallory-Weiss tear.  He then required urgent hospitalization and repeat upper endoscopy for Hemoclip control by Dr. Havery Moros.  He has been on initially a liquid diet and now a soft diet, which has caused him to lose several pounds since then.  This episode occurred 4 weeks ago and he continues to have no abdominal pain, chest pain or dyspnea.  His energy has significantly improved since his hemoglobin normalized, and he has stopped iron therapy.  He denies dysphagia, black or bloody stool.  Gastric biopsies discovered H. pylori, and he has completed quadruple therapy.   ROS: Cardiovascular:  no chest pain Respiratory: no dyspnea  The patient's Past Medical, Family and Social History were reviewed and are on file in the EMR.  Objective:  Med list reviewed  Current Outpatient Medications:  .  amLODipine (NORVASC) 5 MG tablet, Take 1 tablet (5 mg total) by mouth daily., Disp: 90 tablet, Rfl: 3 .  diltiazem (TIAZAC) 120 MG 24 hr capsule, Take 120 mg by mouth daily., Disp: , Rfl: 1 .  ferrous sulfate 325 (65 FE) MG EC tablet, Take 325 mg by mouth 2 (two) times daily between meals., Disp: , Rfl:  .  hydrochlorothiazide (HYDRODIURIL) 25 MG tablet, Take 1 tablet (25 mg total) by mouth daily., Disp: 90 tablet, Rfl: 3 .  metroNIDAZOLE (FLAGYL) 500 MG tablet, Take 1 tablet (500 mg total) by mouth 3 (three) times daily., Disp: 30 tablet, Rfl: 0 .  Multiple Vitamins-Minerals (CENTRUM SILVER ADULT 50+) TABS, Take by mouth., Disp: , Rfl:  .  rosuvastatin (CRESTOR) 20 MG tablet, Take 1 tablet (20 mg total) by mouth daily., Disp: 90 tablet, Rfl: 3 .  aspirin 81 MG tablet, Take 1 tablet (81 mg total) by mouth  daily. Hold for the next few days, resume once GI oks next week. (Patient not taking: Reported on 11/20/2017), Disp: 30 tablet, Rfl: 0 .  omeprazole (PRILOSEC) 20 MG capsule, Take 1 capsule (20 mg total) by mouth 2 (two) times daily for 14 days., Disp: 28 capsule, Rfl: 0   Vital signs in last 24 hrs: Vitals:   11/20/17 1534  BP: 106/60  Pulse: 68    Physical Exam  Well-appearing man accompanied by his wife  HEENT: sclera anicteric, oral mucosa moist without lesions  Neck: supple, no thyromegaly, JVD or lymphadenopathy  Cardiac: RRR without murmurs, S1S2 heard, no peripheral edema  Pulm: clear to auscultation bilaterally, normal RR and effort noted  Abdomen: soft, no tenderness, with active bowel sounds. No guarding or palpable hepatosplenomegaly.  Skin; warm and dry, no jaundice or rash  Recent Labs:  CBC Latest Ref Rng & Units 11/01/2017 10/20/2017 10/19/2017  WBC 4.0 - 10.5 K/uL 4.3 5.5 4.1  Hemoglobin 13.0 - 17.0 g/dL 14.0 12.0(L) 13.2  Hematocrit 39.0 - 52.0 % 43.5 39.0 42.4  Platelets 150.0 - 400.0 K/uL 255.0 223 211    Gastric biopsy positive for H. pylori  Upper endoscopy by Dr. Havery Moros was reviewed.  I also discussed the case extensively with him during that hospitalization.   @ASSESSMENTPLANBEGIN @ Assessment: Encounter Diagnoses  Name Primary?  . Mallory-Weiss tear Yes  . Iron deficiency anemia, unspecified iron deficiency anemia type   . H. pylori  infection     Iron deficiency anemia that improved rapidly on iron therapy and now normalized. Source is unclear, he needs a small bowel video capsule study. H. pylori gastritis, completed therapy.  Plan: Urea breath test Chest x-ray today.  If hemoclips have passed, we will schedule video capsule study   Total time 20 minutes, over half spent face-to-face with patient in counseling and coordination of care.   Nelida Meuse III

## 2017-11-20 NOTE — Patient Instructions (Signed)
If you are age 68 or older, your body mass index should be between 23-30. Your Body mass index is 24.69 kg/m. If this is out of the aforementioned range listed, please consider follow up with your Primary Care Provider.  If you are age 74 or younger, your body mass index should be between 19-25. Your Body mass index is 24.69 kg/m. If this is out of the aformentioned range listed, please consider follow up with your Primary Care Provider.   Your provider has requested that you go to the basement level for a chest xray before leaving today. Press "B" on the elevator.  It was a pleasure to meet you today!  Dr. Loletha Carrow

## 2017-11-21 ENCOUNTER — Ambulatory Visit (INDEPENDENT_AMBULATORY_CARE_PROVIDER_SITE_OTHER)
Admission: RE | Admit: 2017-11-21 | Discharge: 2017-11-21 | Disposition: A | Discharge status: Home / Self Care | Payer: Medicare Other | Source: Ambulatory Visit | Attending: Gastroenterology | Admitting: Gastroenterology

## 2017-11-21 ENCOUNTER — Other Ambulatory Visit: Payer: Self-pay

## 2017-11-21 ENCOUNTER — Other Ambulatory Visit: Payer: Self-pay | Admitting: Gastroenterology

## 2017-11-21 DIAGNOSIS — A048 Other specified bacterial intestinal infections: Secondary | ICD-10-CM | POA: Diagnosis not present

## 2017-11-21 DIAGNOSIS — K226 Gastro-esophageal laceration-hemorrhage syndrome: Secondary | ICD-10-CM

## 2017-11-21 DIAGNOSIS — J984 Other disorders of lung: Secondary | ICD-10-CM | POA: Diagnosis not present

## 2017-11-22 LAB — H. PYLORI BREATH TEST: H pylori Breath Test: NEGATIVE

## 2017-12-19 ENCOUNTER — Telehealth: Payer: Self-pay

## 2017-12-19 ENCOUNTER — Ambulatory Visit (INDEPENDENT_AMBULATORY_CARE_PROVIDER_SITE_OTHER)
Admission: RE | Admit: 2017-12-19 | Discharge: 2017-12-19 | Disposition: A | Discharge status: Home / Self Care | Payer: Medicare Other | Source: Ambulatory Visit | Attending: Gastroenterology | Admitting: Gastroenterology

## 2017-12-19 ENCOUNTER — Other Ambulatory Visit: Payer: Self-pay

## 2017-12-19 DIAGNOSIS — R918 Other nonspecific abnormal finding of lung field: Secondary | ICD-10-CM | POA: Diagnosis not present

## 2017-12-19 DIAGNOSIS — K226 Gastro-esophageal laceration-hemorrhage syndrome: Secondary | ICD-10-CM

## 2017-12-19 NOTE — Telephone Encounter (Signed)
Patient advised to come get his CXR done today or sometime this week.

## 2017-12-19 NOTE — Telephone Encounter (Signed)
-----   Message from Doristine Counter, RN sent at 11/21/2017  1:54 PM EDT ----- Call patient to come get CXR, due 6/18. Order in [place].

## 2017-12-22 ENCOUNTER — Other Ambulatory Visit: Payer: Self-pay

## 2017-12-22 DIAGNOSIS — D509 Iron deficiency anemia, unspecified: Secondary | ICD-10-CM

## 2018-01-02 ENCOUNTER — Encounter: Payer: Self-pay | Admitting: Gastroenterology

## 2018-01-02 ENCOUNTER — Ambulatory Visit (INDEPENDENT_AMBULATORY_CARE_PROVIDER_SITE_OTHER): Payer: Medicare Other | Admitting: Gastroenterology

## 2018-01-02 DIAGNOSIS — D500 Iron deficiency anemia secondary to blood loss (chronic): Secondary | ICD-10-CM | POA: Diagnosis not present

## 2018-01-02 NOTE — Patient Instructions (Addendum)
  You may have clear liquids beginning at 10:30 am after ingesting the capsule  At 1:00 pm mix 17 GMs of Miralax in 8 oz of liquid,  of your choice,  and drink.   You should pass the capsule in your stool 8-48 hours after ingestion. If you have not passed the capsule, after 72 hours, please contact the office at [phone].  Please watch the retrieval video at [email or web address].  It is imperative to retrieve the capsule to avoid repeating the test.  Please keep your capsule retrieval kit with you at all times when visiting the restroom until you have retrieved the capsule.   Please follow the instructions for returning the capsule:  Place the retrieved capsule from the take-home retrieval kit in the provided vial.  Ensure the vial lid is locked and put the vial into the envelope, seal the pre-labeled envelope, and drop if off at any [employer] drop box or physical [employer] location.

## 2018-01-02 NOTE — Progress Notes (Signed)
Patient here for capsule endo.  Patient verbalized understanding of collection and retrieval of capsule.    Lot # [id number] exp 08/[birth year]

## 2018-01-22 ENCOUNTER — Telehealth: Payer: Self-pay | Admitting: Gastroenterology

## 2018-01-22 NOTE — Telephone Encounter (Signed)
Apologies it took so long to review capsule study - I was out of the office last week.  No active bleeding on it.  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  I will talk with them more about it at his upcoming appointment.

## 2018-01-22 NOTE — Telephone Encounter (Signed)
Left message for patient to call back for results.  

## 2018-01-25 NOTE — Telephone Encounter (Signed)
I have not heard back from patient, mailed letter with results and letting patient know that Dr. Loletha Carrow will review everything with them at follow up visit.

## 2018-02-02 ENCOUNTER — Encounter: Payer: Self-pay | Admitting: Gastroenterology

## 2018-02-02 ENCOUNTER — Other Ambulatory Visit (INDEPENDENT_AMBULATORY_CARE_PROVIDER_SITE_OTHER): Payer: Medicare Other

## 2018-02-02 ENCOUNTER — Encounter (INDEPENDENT_AMBULATORY_CARE_PROVIDER_SITE_OTHER): Payer: Self-pay

## 2018-02-02 ENCOUNTER — Ambulatory Visit (INDEPENDENT_AMBULATORY_CARE_PROVIDER_SITE_OTHER): Payer: Medicare Other | Admitting: Gastroenterology

## 2018-02-02 VITALS — BP 132/84 | HR 88 | Ht 70.75 in | Wt 187.0 lb

## 2018-02-02 DIAGNOSIS — K5520 Angiodysplasia of colon without hemorrhage: Secondary | ICD-10-CM | POA: Diagnosis not present

## 2018-02-02 DIAGNOSIS — K226 Gastro-esophageal laceration-hemorrhage syndrome: Secondary | ICD-10-CM

## 2018-02-02 DIAGNOSIS — D500 Iron deficiency anemia secondary to blood loss (chronic): Secondary | ICD-10-CM

## 2018-02-02 LAB — FERRITIN: Ferritin: 30.7 ng/mL (ref 22.0–322.0)

## 2018-02-02 LAB — CBC WITH DIFFERENTIAL/PLATELET
BASOS ABS: 0 10*3/uL (ref 0.0–0.1)
Basophils Relative: 0.8 % (ref 0.0–3.0)
Eosinophils Absolute: 0.1 10*3/uL (ref 0.0–0.7)
Eosinophils Relative: 2.7 % (ref 0.0–5.0)
HCT: 40.6 % (ref 39.0–52.0)
Hemoglobin: 13.8 g/dL (ref 13.0–17.0)
LYMPHS ABS: 1.6 10*3/uL (ref 0.7–4.0)
LYMPHS PCT: 34.7 % (ref 12.0–46.0)
MCHC: 34 g/dL (ref 30.0–36.0)
MCV: 80.3 fl (ref 78.0–100.0)
MONOS PCT: 10.9 % (ref 3.0–12.0)
Monocytes Absolute: 0.5 10*3/uL (ref 0.1–1.0)
Neutro Abs: 2.3 10*3/uL (ref 1.4–7.7)
Neutrophils Relative %: 50.9 % (ref 43.0–77.0)
Platelets: 189 10*3/uL (ref 150.0–400.0)
RBC: 5.05 Mil/uL (ref 4.22–5.81)
RDW: 15.6 % — ABNORMAL HIGH (ref 11.5–15.5)
WBC: 4.6 10*3/uL (ref 4.0–10.5)

## 2018-02-02 LAB — IBC PANEL
Iron: 95 ug/dL (ref 42–165)
SATURATION RATIOS: 21.4 % (ref 20.0–50.0)
TRANSFERRIN: 317 mg/dL (ref 212.0–360.0)

## 2018-02-02 NOTE — Patient Instructions (Signed)
If you are age 68 or older, your body mass index should be between 23-30. Your Body mass index is 26.27 kg/m. If this is out of the aforementioned range listed, please consider follow up with your Primary Care Provider.  If you are age 54 or younger, your body mass index should be between 19-25. Your Body mass index is 26.27 kg/m. If this is out of the aformentioned range listed, please consider follow up with your Primary Care Provider.   Your provider has requested that you go to the basement level for lab work before leaving today. Press "B" on the elevator. The lab is located at the first door on the left as you exit the elevator.  It was a pleasure to meet you today!  Dr. Loletha Carrow

## 2018-02-02 NOTE — Progress Notes (Signed)
     [facility] Progress Note  Chief Complaint: Iron deficiency anemia  Subjective  History:  David Gilmore follows up after recent video capsule study for iron deficiency anemia.  His blood work responded well to oral iron therapy.  Capsule study showed nonbleeding AVMs beyond the reach of an enteroscope. He had a Mallory-Weiss tear from coughing during upper endoscopy, which required hospitalization and repeat upper endoscopy for control.  He completely recovered from that.  He denies dysphagia or odynophagia.  He denies abdominal pain, nausea, loss of appetite, weight loss, black or bloody stool.  He never had any localizing GI symptoms, his anemia was discovered because of fatigue.  ROS: Cardiovascular:  no chest pain Respiratory: no dyspnea  The patient's Past Medical, Family and Social History were reviewed and are on file in the EMR.  Objective:  Med list reviewed  Current Outpatient Medications:  .  amLODipine (NORVASC) 5 MG tablet, Take 1 tablet (5 mg total) by mouth daily., Disp: 90 tablet, Rfl: 3 .  diltiazem (TIAZAC) 120 MG 24 hr capsule, Take 120 mg by mouth daily., Disp: , Rfl: 1 .  hydrochlorothiazide (HYDRODIURIL) 25 MG tablet, Take 1 tablet (25 mg total) by mouth daily., Disp: 90 tablet, Rfl: 3 .  Multiple Vitamins-Minerals (CENTRUM SILVER ADULT 50+) TABS, Take by mouth., Disp: , Rfl:  .  rosuvastatin (CRESTOR) 20 MG tablet, Take 1 tablet (20 mg total) by mouth daily., Disp: 90 tablet, Rfl: 3   Vital signs in last 24 hrs: Vitals:   02/02/18 1356  BP: 132/84  Pulse: 88    Physical Exam  He is accompanied by his wife as always, and he looks well. No exam.  Entire time spent in discussion.  Recent Labs:  CBC Latest Ref Rng & Units 11/01/2017 10/20/2017 10/19/2017  WBC 4.0 - 10.5 K/uL 4.3 5.5 4.1  Hemoglobin 13.0 - 17.0 g/dL 14.0 12.0(L) 13.2  Hematocrit 39.0 - 52.0 % 43.5 39.0 42.4  Platelets 150.0 - 400.0 K/uL 255.0 223 211   Iron/TIBC/Ferritin/ %Sat      Component Value Date/Time   IRON 76 11/01/2017 1222   TIBC 380 11/01/2017 1222   FERRITIN 23 11/01/2017 1222   IRONPCTSAT 20 11/01/2017 1222     Radiologic studies: Chest x-ray eventually showed passage of the esophageal clips that had been placed during endoscopy for Mallory-Weiss tear.  Capsule study was then performed after that confirmation.   @ASSESSMENTPLANBEGIN @ Assessment: Encounter Diagnoses  Name Primary?  . Iron deficiency anemia secondary to blood loss (chronic) Yes  . AVM (arteriovenous malformation) of small bowel, acquired   . 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  We discussed the nature and variable course of these.  His blood counts responded well to iron, and he stopped the iron as previously advised.  Plan: Check CBC and iron levels.  Resume iron if needed.  If normal, follow-up with primary care for periodic check of blood counts and iron levels.  Refer back to GI as needed.   Total time 15 minutes, over half spent face-to-face with patient in counseling and coordination of care.   Nelida Meuse III

## 2018-02-16 ENCOUNTER — Other Ambulatory Visit: Payer: Self-pay | Admitting: Family Medicine

## 2018-05-07 DIAGNOSIS — R9439 Abnormal result of other cardiovascular function study: Secondary | ICD-10-CM | POA: Diagnosis not present

## 2018-05-07 DIAGNOSIS — I10 Essential (primary) hypertension: Secondary | ICD-10-CM | POA: Diagnosis not present

## 2018-05-07 DIAGNOSIS — R0609 Other forms of dyspnea: Secondary | ICD-10-CM | POA: Diagnosis not present

## 2018-05-07 DIAGNOSIS — E785 Hyperlipidemia, unspecified: Secondary | ICD-10-CM | POA: Diagnosis not present

## 2018-05-08 NOTE — Progress Notes (Signed)
Called Patient got him scheduled for 05/24/18

## 2018-05-24 ENCOUNTER — Encounter: Payer: Self-pay | Admitting: Family Medicine

## 2018-05-24 ENCOUNTER — Ambulatory Visit (INDEPENDENT_AMBULATORY_CARE_PROVIDER_SITE_OTHER): Payer: Medicare Other | Admitting: Family Medicine

## 2018-05-24 VITALS — BP 134/84 | HR 82 | Temp 98.6°F | Ht 71.0 in | Wt 190.4 lb

## 2018-05-24 DIAGNOSIS — Z8679 Personal history of other diseases of the circulatory system: Secondary | ICD-10-CM

## 2018-05-24 DIAGNOSIS — R0989 Other specified symptoms and signs involving the circulatory and respiratory systems: Secondary | ICD-10-CM

## 2018-05-24 DIAGNOSIS — E785 Hyperlipidemia, unspecified: Secondary | ICD-10-CM | POA: Diagnosis not present

## 2018-05-24 DIAGNOSIS — D649 Anemia, unspecified: Secondary | ICD-10-CM | POA: Diagnosis not present

## 2018-05-24 DIAGNOSIS — I10 Essential (primary) hypertension: Secondary | ICD-10-CM

## 2018-05-24 LAB — COMPREHENSIVE METABOLIC PANEL
ALK PHOS: 54 U/L (ref 39–117)
ALT: 19 U/L (ref 0–53)
AST: 22 U/L (ref 0–37)
Albumin: 4.3 g/dL (ref 3.5–5.2)
BILIRUBIN TOTAL: 0.4 mg/dL (ref 0.2–1.2)
BUN: 15 mg/dL (ref 6–23)
CALCIUM: 10 mg/dL (ref 8.4–10.5)
CO2: 31 meq/L (ref 19–32)
CREATININE: 1.19 mg/dL (ref 0.40–1.50)
Chloride: 103 mEq/L (ref 96–112)
GFR: 78.04 mL/min (ref >60.00)
Glucose, Bld: 82 mg/dL (ref 70–99)
POTASSIUM: 3.4 meq/L — AB (ref 3.5–5.1)
Sodium: 140 mEq/L (ref 135–145)
TOTAL PROTEIN: 7.5 g/dL (ref 6.0–8.3)

## 2018-05-24 LAB — CBC
HEMATOCRIT: 41.9 % (ref 39.0–52.0)
Hemoglobin: 14.1 g/dL (ref 13.0–17.0)
MCHC: 33.6 g/dL (ref 30.0–36.0)
MCV: 81.8 fl (ref 78.0–100.0)
PLATELETS: 230 10*3/uL (ref 150.0–400.0)
RBC: 5.12 Mil/uL (ref 4.22–5.81)
RDW: 13.6 % (ref 11.5–15.5)
WBC: 3.8 10*3/uL — ABNORMAL LOW (ref 4.0–10.5)

## 2018-05-24 NOTE — Progress Notes (Signed)
Subjective:  David Gilmore is a 67 y.o. year old very pleasant male patient who presents for/with See problem oriented charting ROS- Review of Systems  Constitutional: Negative for chills and fever.  HENT: Negative for ear pain and tinnitus.   Eyes: Negative for blurred vision.  Respiratory: Positive for wheezing. Negative for cough.        Cardiologist heard wheezing last week.    Cardiovascular: Negative for chest pain and palpitations.  Gastrointestinal: Negative for nausea and vomiting.  Genitourinary: Negative for dysuria and urgency.  Musculoskeletal: Negative for back pain and neck pain.  Skin: Negative.   Neurological: Negative for dizziness.  Endo/Heme/Allergies: Does not bruise/bleed easily.  Psychiatric/Behavioral: Negative for depression.   Past Medical History-  Patient Active Problem List   Diagnosis Date Noted  . Mallory-Weiss tear     Priority: High  . History of heart failure     Priority: High  . Anemia 08/23/2017    Priority: High  . BPH associated with nocturia 08/16/2017    Priority: Medium  . Shortness of breath 08/16/2017    Priority: Medium  . Essential hypertension 06/22/2015    Priority: Medium  . Hyperlipidemia     Priority: Medium  . Aortic atherosclerosis ([place]) 08/16/2017    Priority: Low  . Abnormal stress test 07/27/2015    Priority: Low  . Former smoker 06/15/2015    Priority: Low   Medications- reviewed and updated Current Outpatient Medications  Medication Sig Dispense Refill  . amLODipine (NORVASC) 5 MG tablet Take 1 tablet (5 mg total) by mouth daily. 90 tablet 3  . aspirin 81 MG tablet Take 81 mg by mouth daily.    Marland Kitchen diltiazem (TIAZAC) 120 MG 24 hr capsule Take 120 mg by mouth daily.  1  . hydrochlorothiazide (HYDRODIURIL) 25 MG tablet Take 1 tablet (25 mg total) by mouth daily. 90 tablet 3  . Multiple Vitamins-Minerals (CENTRUM SILVER ADULT 50+) TABS Take by mouth.    . rosuvastatin (CRESTOR) 20 MG tablet TAKE 1 TABLET BY  MOUTH ONCE DAILY 90 tablet 3   No current facility-administered medications for this visit.     Objective: BP 134/84   Pulse 82   Temp 98.6 F (37 C) (Oral)   Ht 5\' 11"  (1.803 m)   Wt 190 lb 6.4 oz (86.4 kg)   SpO2 96%   BMI 26.56 kg/m  Gen: NAD, resting comfortably CV: RRR no murmurs rubs or gallops, weak PT and DP pulses bilaterally Lungs: CTAB no crackles, wheeze, rhonchi Abdomen: soft/nontender/nondistended/normal bowel sounds. No rebound or guarding.  Ext: no edema Skin: warm, dry Neuro: Speech normal, moves all extremities  Assessment/Plan:  Other notes: 1.  Wheezing noted by Dr. Einar Gip last visit- No wheeze on my exam.  Patient denies shortness of breath  Hyperlipidemia S: Well controlled on rosuvastatin 20 mg-last LDL at 75 in February A/P: Stable-continue current medications  Essential hypertension S: controlled on amlodipine 5 mg, diltiazem 120 mg extended release, HCTZ 25 mg BP Readings from Last 3 Encounters:  05/24/18 134/84  02/02/18 132/84  11/20/17 106/60  A/P: We discussed blood pressure goal of <140/90. Continue current meds  Anemia S:Chronic anemia from small bowel AVMs.-Labs stable at GI 3 months ago-they asked [place] to check these intermittently with no regular GI follow-up planned at this point A/P: We will update labs today including ferritin and CBC-hopefully stable  History of heart failure S: In 2016 we did a echocardiogram which showed ejection fraction 45 to 50%  with mild diffuse hypokinesis -we did a stress test with myocardial perfusion imaging-this was a low risk study.  Patient follow with Dr. Einar Gip for some time but has been released recently-his echocardiogram showed normal ejection fraction this year at 55% on 09/26/2017 A/P: Stable-cardiology has released him.  Does not need diuretic other than hydrochlorothiazide.  Fortunately ejection fraction has normalized-perhaps improvement is from better control of hypertension  Dr. Einar Gip did note  absent pedal pulses-on exam today they are weak-we will get ABI testing- patient denies claudication  Future Appointments  Date Time Provider [place]  06/04/2018  2:00 PM [facility] 3 [facility]   Lab/Order associations: Anemia, unspecified type - Plan: CBC, Iron, TIBC and Ferritin Panel  Essential hypertension - Plan: Comprehensive metabolic panel  Weak pulse - Plan: VAS [place] ABI WITH/WO TBI  Hyperlipidemia, unspecified hyperlipidemia type  History of heart failure  Return precautions advised.  [name], MD

## 2018-05-24 NOTE — Assessment & Plan Note (Signed)
S: In 2016 we did a echocardiogram which showed ejection fraction 45 to 50% with mild diffuse hypokinesis -we did a stress test with myocardial perfusion imaging-this was a low risk study.  Patient follow with Dr. Einar Gip for some time but has been released recently-his echocardiogram showed normal ejection fraction this year at 55% on 09/26/2017 A/P: Stable-cardiology has released him.  Does not need diuretic other than hydrochlorothiazide.  Fortunately ejection fraction has normalized-perhaps improvement is from better control of hypertension

## 2018-05-24 NOTE — Assessment & Plan Note (Signed)
S: Well controlled on rosuvastatin 20 mg-last LDL at 77 in February A/P: Stable-continue current medications

## 2018-05-24 NOTE — Patient Instructions (Addendum)
Please stop by lab before you go  We will call you within two weeks about your referral to cardiology office for ankle brachial index test. If you do not hear within 3 weeks, give [place] a call.   No changes in medicine anticipated   Great to see you and have a Happy Thanksgiving!

## 2018-05-24 NOTE — Assessment & Plan Note (Signed)
S:Chronic anemia from small bowel AVMs.-Labs stable at GI 3 months ago-they asked [place] to check these intermittently with no regular GI follow-up planned at this point A/P: We will update labs today including ferritin and CBC-hopefully stable

## 2018-05-24 NOTE — Assessment & Plan Note (Signed)
S: controlled on amlodipine 5 mg, diltiazem 120 mg extended release, HCTZ 25 mg BP Readings from Last 3 Encounters:  05/24/18 134/84  02/02/18 132/84  11/20/17 106/60  A/P: We discussed blood pressure goal of <140/90. Continue current meds

## 2018-05-25 LAB — IRON,TIBC AND FERRITIN PANEL
%SAT: 19 % (calc) — ABNORMAL LOW (ref 20–48)
FERRITIN: 23 ng/mL — AB (ref 24–380)
IRON: 83 ug/dL (ref 50–180)
TIBC: 429 mcg/dL (calc) — ABNORMAL HIGH (ref 250–425)

## 2018-06-04 ENCOUNTER — Encounter (HOSPITAL_COMMUNITY): Payer: Medicare Other

## 2018-06-15 ENCOUNTER — Encounter (HOSPITAL_COMMUNITY): Payer: Medicare Other

## 2018-06-18 ENCOUNTER — Ambulatory Visit (HOSPITAL_COMMUNITY): Payer: Medicare Other

## 2018-06-29 ENCOUNTER — Ambulatory Visit (HOSPITAL_COMMUNITY)
Admission: RE | Admit: 2018-06-29 | Discharge: 2018-06-29 | Disposition: A | Discharge status: Home / Self Care | Payer: Medicare Other | Source: Ambulatory Visit | Attending: Cardiovascular Disease | Admitting: Cardiovascular Disease

## 2018-06-29 DIAGNOSIS — R0989 Other specified symptoms and signs involving the circulatory and respiratory systems: Secondary | ICD-10-CM | POA: Diagnosis not present

## 2018-07-13 DIAGNOSIS — N410 Acute prostatitis: Secondary | ICD-10-CM | POA: Diagnosis not present

## 2018-07-13 DIAGNOSIS — R309 Painful micturition, unspecified: Secondary | ICD-10-CM | POA: Diagnosis not present

## 2018-08-08 DIAGNOSIS — A549 Gonococcal infection, unspecified: Secondary | ICD-10-CM | POA: Diagnosis not present

## 2018-08-28 ENCOUNTER — Other Ambulatory Visit: Payer: Self-pay | Admitting: Family Medicine

## 2018-08-31 ENCOUNTER — Other Ambulatory Visit: Payer: Self-pay | Admitting: Family Medicine

## 2018-09-18 ENCOUNTER — Encounter: Payer: Self-pay | Admitting: Family Medicine

## 2018-09-18 ENCOUNTER — Ambulatory Visit (INDEPENDENT_AMBULATORY_CARE_PROVIDER_SITE_OTHER): Payer: Medicare Other | Admitting: Family Medicine

## 2018-09-18 ENCOUNTER — Other Ambulatory Visit: Payer: Self-pay

## 2018-09-18 VITALS — BP 138/88 | HR 83 | Temp 98.6°F | Ht 71.0 in | Wt 192.2 lb

## 2018-09-18 DIAGNOSIS — R790 Abnormal level of blood mineral: Secondary | ICD-10-CM | POA: Diagnosis not present

## 2018-09-18 DIAGNOSIS — I739 Peripheral vascular disease, unspecified: Secondary | ICD-10-CM

## 2018-09-18 DIAGNOSIS — E785 Hyperlipidemia, unspecified: Secondary | ICD-10-CM

## 2018-09-18 DIAGNOSIS — I10 Essential (primary) hypertension: Secondary | ICD-10-CM

## 2018-09-18 DIAGNOSIS — D649 Anemia, unspecified: Secondary | ICD-10-CM | POA: Diagnosis not present

## 2018-09-18 DIAGNOSIS — I700 Atherosclerosis of aorta: Secondary | ICD-10-CM

## 2018-09-18 LAB — CBC WITH DIFFERENTIAL/PLATELET
Basophils Absolute: 0 10*3/uL (ref 0.0–0.1)
Basophils Relative: 1 % (ref 0.0–3.0)
Eosinophils Absolute: 0.1 10*3/uL (ref 0.0–0.7)
Eosinophils Relative: 2.4 % (ref 0.0–5.0)
HCT: 43.1 % (ref 39.0–52.0)
Hemoglobin: 14.5 g/dL (ref 13.0–17.0)
Lymphocytes Relative: 37 % (ref 12.0–46.0)
Lymphs Abs: 1.4 10*3/uL (ref 0.7–4.0)
MCHC: 33.6 g/dL (ref 30.0–36.0)
MCV: 81 fl (ref 78.0–100.0)
MONO ABS: 0.5 10*3/uL (ref 0.1–1.0)
Monocytes Relative: 13.1 % — ABNORMAL HIGH (ref 3.0–12.0)
Neutro Abs: 1.7 10*3/uL (ref 1.4–7.7)
Neutrophils Relative %: 46.5 % (ref 43.0–77.0)
Platelets: 224 10*3/uL (ref 150.0–400.0)
RBC: 5.32 Mil/uL (ref 4.22–5.81)
RDW: 14.1 % (ref 11.5–15.5)
WBC: 3.7 10*3/uL — ABNORMAL LOW (ref 4.0–10.5)

## 2018-09-18 LAB — COMPREHENSIVE METABOLIC PANEL
ALBUMIN: 4.4 g/dL (ref 3.5–5.2)
ALT: 19 U/L (ref 0–53)
AST: 18 U/L (ref 0–37)
Alkaline Phosphatase: 59 U/L (ref 39–117)
BUN: 12 mg/dL (ref 6–23)
CALCIUM: 10.1 mg/dL (ref 8.4–10.5)
CHLORIDE: 100 meq/L (ref 96–112)
CO2: 33 mEq/L — ABNORMAL HIGH (ref 19–32)
CREATININE: 1.05 mg/dL (ref 0.40–1.50)
GFR: 84.76 mL/min (ref >60.00)
Glucose, Bld: 74 mg/dL (ref 70–99)
POTASSIUM: 3.5 meq/L (ref 3.5–5.1)
Sodium: 140 mEq/L (ref 135–145)
Total Bilirubin: 0.5 mg/dL (ref 0.2–1.2)
Total Protein: 7.4 g/dL (ref 6.0–8.3)

## 2018-09-18 LAB — IBC + FERRITIN
FERRITIN: 17.9 ng/mL — AB (ref 22.0–322.0)
IRON: 64 ug/dL (ref 42–165)
SATURATION RATIOS: 12.8 % — AB (ref 20.0–50.0)
TRANSFERRIN: 356 mg/dL (ref 212.0–360.0)

## 2018-09-18 LAB — LIPID PANEL
CHOLESTEROL: 142 mg/dL (ref 0–200)
HDL: 35.5 mg/dL — ABNORMAL LOW (ref >39.00)
LDL Cholesterol: 86 mg/dL (ref 0–99)
NonHDL: 106.64
Total CHOL/HDL Ratio: 4
Triglycerides: 105 mg/dL (ref 0.0–149.0)
VLDL: 21 mg/dL (ref 0.0–40.0)

## 2018-09-18 MED ORDER — SILDENAFIL CITRATE 20 MG PO TABS: ORAL_TABLET | ORAL | 3 refills | Status: DC

## 2018-09-18 NOTE — Patient Instructions (Addendum)
Please stop by lab before you go If you do not have mychart- we will call you about results within 5 business days of [place] receiving them.  If you have mychart- we will send your results within 3 business days of [place] receiving them.  If abnormal or we want to clarify a result, we will call or mychart you to make sure you receive the message.  If you have questions or concerns or don't hear within 5-7 days, please send [place] a message or call us.   Try generic viagra/sildenafil if affordable

## 2018-09-18 NOTE — Progress Notes (Signed)
Phone [phone]   Subjective:  David Gilmore is a 68 y.o. year old very pleasant male patient who presents for/with See problem oriented charting ROS- no reported claudication or edema. No chest pain or shortness of breath reported.    Past Medical History-  Patient Active Problem List   Diagnosis Date Noted  . PAD (peripheral artery disease) ([place]) 09/19/2018    Priority: High  . Mallory-Weiss tear     Priority: High  . History of heart failure     Priority: High  . Anemia 08/23/2017    Priority: High  . BPH associated with nocturia 08/16/2017    Priority: Medium  . Shortness of breath 08/16/2017    Priority: Medium  . Essential hypertension 06/22/2015    Priority: Medium  . Hyperlipidemia     Priority: Medium  . Aortic atherosclerosis ([place]) 08/16/2017    Priority: Low  . Abnormal stress test 07/27/2015    Priority: Low  . Former smoker 06/15/2015    Priority: Low    Medications- reviewed and updated Current Outpatient Medications  Medication Sig Dispense Refill  . amLODipine (NORVASC) 5 MG tablet TAKE 1 TABLET BY MOUTH ONCE DAILY 90 tablet 0  . aspirin 81 MG tablet Take 81 mg by mouth daily.    Marland Kitchen diltiazem (TIAZAC) 120 MG 24 hr capsule Take 120 mg by mouth daily.  1  . hydrochlorothiazide (HYDRODIURIL) 25 MG tablet TAKE 1 TABLET BY MOUTH ONCE DAILY 90 tablet 0  . Multiple Vitamins-Minerals (CENTRUM SILVER ADULT 50+) TABS Take by mouth.    . rosuvastatin (CRESTOR) 20 MG tablet TAKE 1 TABLET BY MOUTH ONCE DAILY 90 tablet 3   No current facility-administered medications for this visit.      Objective:  BP 138/88   Pulse 83   Temp 98.6 F (37 C) (Oral)   Ht 5\' 11"  (1.803 m)   Wt 192 lb 3.2 oz (87.2 kg)   SpO2 99%   BMI 26.81 kg/m  Gen: NAD, resting comfortably CV: RRR no murmurs rubs or gallops Lungs: CTAB no crackles, wheeze, rhonchi Abdomen: soft/nontender/nondistended/normal bowel sounds. No rebound or guarding.  Ext: no edema Skin: warm, dry  Neuro: normal gait and speech    Assessment and Plan    #hypertension S: controlled on amlodipine 5 mg and diltiazem 120 mg extended release as well as HCTZ 25 mg BP Readings from Last 3 Encounters:  09/18/18 138/88  05/24/18 134/84  02/02/18 132/84  A/P:  Stable. Continue current medications.     #hyperlipidemia/aortic atherosclerosis/PAD S: Mild poorly controlled on atorvastatin 40 mg Lab Results  Component Value Date   CHOL 142 09/18/2018   HDL 35.50 (L) 09/18/2018   LDLCALC 86 09/18/2018   LDLDIRECT 60.0 10/29/2015   TRIG 105.0 09/18/2018   CHOLHDL 4 09/18/2018   A/P: We will increase to rosuvastatin 40 mg- team sent this in as to 20 mg tablets but I updated prescription to 40 mg.  I think an LDL target under 70 is reasonable-with aortic atherosclerosis and PAD in left leg   #Anemia S: Unfortunately ferritin levels have not significantly climbed on iron every other day.  Largely stable A/P: Stable-continue iron 2-3 times a week.  Continue aspirin for now with PAD-if symptoms worsen and patient without claudication may stop aspirin  PAD (peripheral artery disease) ([place]) S: From 07/02/2018 arterial study- "Left: Resting left ankle-brachial index indicates moderate left lower extremity arterial disease. The left toe-brachial index is abnormal. CW waveforms suggest tibial disease."  Dr. Einar Gip is aware of this but since patient has no claudication recommends no intervention at this time other than maximizing medical management.  Patient compliant with aspirin A/P: Continue aspirin despite low ferritin unless frank GI bleed-outside of coughing during her EGD which is what caused original Mallory-Weiss tear.  We will titrate rosuvastatin up to 40 mg to try to get LDL under 70  6 months would be reasonable  Lab/Order associations: Essential hypertension - Plan: Comprehensive metabolic panel, CBC with Differential/Platelet, Lipid panel  Low ferritin - Plan: IBC + Ferritin  PAD  (peripheral artery disease) ([facility])  Anemia, unspecified type  Meds ordered this encounter  Medications  . sildenafil (REVATIO) 20 MG tablet    Sig: Take 2-5 tablets as needed once every 48 hours for erectile dysfunction    Dispense:  50 tablet    Refill:  3  . rosuvastatin (CRESTOR) 40 MG tablet    Sig: Take 1 tablet (40 mg total) by mouth daily.    Dispense:  90 tablet    Refill:  3    Return precautions advised.  [name], MD

## 2018-09-19 ENCOUNTER — Other Ambulatory Visit: Payer: Self-pay

## 2018-09-19 DIAGNOSIS — I739 Peripheral vascular disease, unspecified: Secondary | ICD-10-CM | POA: Insufficient documentation

## 2018-09-19 MED ORDER — ROSUVASTATIN CALCIUM 20 MG PO TABS: 40.0000 mg | ORAL_TABLET | Freq: Every day | ORAL | 3 refills | Status: DC

## 2018-09-19 MED ORDER — ROSUVASTATIN CALCIUM 40 MG PO TABS: 40.0000 mg | ORAL_TABLET | Freq: Every day | ORAL | 3 refills | Status: DC

## 2018-09-19 NOTE — Assessment & Plan Note (Signed)
S: Mild poorly controlled on atorvastatin 40 mg Recent Labs       Lab Results  Component Value Date   CHOL 142 09/18/2018   HDL 35.50 (L) 09/18/2018   LDLCALC 86 09/18/2018   LDLDIRECT 60.0 10/29/2015   TRIG 105.0 09/18/2018   CHOLHDL 4 09/18/2018     A/P: We will increase to rosuvastatin 40 mg- team sent this in as to 20 mg tablets but I updated prescription to 40 mg.  I think an LDL target under 70 is reasonable-with aortic atherosclerosis and PAD in left leg

## 2018-09-19 NOTE — Assessment & Plan Note (Signed)
S: Unfortunately ferritin levels have not significantly climbed on iron every other day.  Largely stable A/P: Stable-continue iron 2-3 times a week.  Continue aspirin for now with PAD-if symptoms worsen and patient without claudication may stop aspirin

## 2018-09-19 NOTE — Assessment & Plan Note (Addendum)
S: From 07/02/2018 arterial study- "Left: Resting left ankle-brachial index indicates moderate left lower extremity arterial disease. The left toe-brachial index is abnormal. CW waveforms suggest tibial disease."  Dr. Einar Gip is aware of this but since patient has no claudication recommends no intervention at this time other than maximizing medical management.  Patient compliant with aspirin A/P: Continue aspirin despite low ferritin unless frank GI bleed-outside of coughing during her EGD which is what caused original Mallory-Weiss tear.  We will titrate rosuvastatin up to 40 mg to try to get LDL under 70

## 2018-09-25 ENCOUNTER — Emergency Department (HOSPITAL_COMMUNITY): Payer: Medicare Other

## 2018-09-25 ENCOUNTER — Emergency Department (HOSPITAL_COMMUNITY)
Admission: EM | Admit: 2018-09-25 | Discharge: 2018-09-25 | Disposition: A | Discharge status: Home / Self Care | Payer: Medicare Other | Attending: Emergency Medicine | Admitting: Emergency Medicine

## 2018-09-25 ENCOUNTER — Other Ambulatory Visit: Payer: Self-pay

## 2018-09-25 ENCOUNTER — Encounter (HOSPITAL_COMMUNITY): Payer: Self-pay

## 2018-09-25 ENCOUNTER — Telehealth: Payer: Self-pay | Admitting: Family Medicine

## 2018-09-25 ENCOUNTER — Ambulatory Visit: Payer: Self-pay

## 2018-09-25 DIAGNOSIS — J189 Pneumonia, unspecified organism: Secondary | ICD-10-CM | POA: Diagnosis not present

## 2018-09-25 DIAGNOSIS — R000 Tachycardia, unspecified: Secondary | ICD-10-CM | POA: Diagnosis not present

## 2018-09-25 DIAGNOSIS — J181 Lobar pneumonia, unspecified organism: Secondary | ICD-10-CM | POA: Insufficient documentation

## 2018-09-25 DIAGNOSIS — R0789 Other chest pain: Secondary | ICD-10-CM | POA: Diagnosis present

## 2018-09-25 DIAGNOSIS — Z79899 Other long term (current) drug therapy: Secondary | ICD-10-CM | POA: Diagnosis not present

## 2018-09-25 DIAGNOSIS — Z87891 Personal history of nicotine dependence: Secondary | ICD-10-CM | POA: Diagnosis not present

## 2018-09-25 DIAGNOSIS — I739 Peripheral vascular disease, unspecified: Secondary | ICD-10-CM | POA: Diagnosis not present

## 2018-09-25 DIAGNOSIS — I10 Essential (primary) hypertension: Secondary | ICD-10-CM | POA: Insufficient documentation

## 2018-09-25 DIAGNOSIS — R0781 Pleurodynia: Secondary | ICD-10-CM | POA: Insufficient documentation

## 2018-09-25 DIAGNOSIS — R079 Chest pain, unspecified: Secondary | ICD-10-CM | POA: Diagnosis not present

## 2018-09-25 LAB — CBC WITH DIFFERENTIAL/PLATELET
ABS IMMATURE GRANULOCYTES: 0.03 10*3/uL (ref 0.00–0.07)
Basophils Absolute: 0 10*3/uL (ref 0.0–0.1)
Basophils Relative: 1 %
Eosinophils Absolute: 0.1 10*3/uL (ref 0.0–0.5)
Eosinophils Relative: 1 %
HCT: 49.7 % (ref 39.0–52.0)
Hemoglobin: 16.2 g/dL (ref 13.0–17.0)
Immature Granulocytes: 0 %
LYMPHS ABS: 2 10*3/uL (ref 0.7–4.0)
Lymphocytes Relative: 26 %
MCH: 26.3 pg (ref 26.0–34.0)
MCHC: 32.6 g/dL (ref 30.0–36.0)
MCV: 80.7 fL (ref 80.0–100.0)
Monocytes Absolute: 0.9 10*3/uL (ref 0.1–1.0)
Monocytes Relative: 12 %
Neutro Abs: 4.5 10*3/uL (ref 1.7–7.7)
Neutrophils Relative %: 60 %
Platelets: 260 10*3/uL (ref 150–400)
RBC: 6.16 MIL/uL — ABNORMAL HIGH (ref 4.22–5.81)
RDW: 13.5 % (ref 11.5–15.5)
WBC: 7.6 10*3/uL (ref 4.0–10.5)
nRBC: 0 % (ref 0.0–0.2)

## 2018-09-25 LAB — COMPREHENSIVE METABOLIC PANEL
ALK PHOS: 61 U/L (ref 38–126)
ALT: 22 U/L (ref 0–44)
AST: 22 U/L (ref 15–41)
Albumin: 4.3 g/dL (ref 3.5–5.0)
Anion gap: 5 (ref 5–15)
BILIRUBIN TOTAL: 0.8 mg/dL (ref 0.3–1.2)
BUN: 9 mg/dL (ref 8–23)
CO2: 27 mmol/L (ref 22–32)
Calcium: 9.7 mg/dL (ref 8.9–10.3)
Chloride: 105 mmol/L (ref 98–111)
Creatinine, Ser: 1.1 mg/dL (ref 0.61–1.24)
GFR calc Af Amer: >60 mL/min (ref >60)
GFR calc non Af Amer: >60 mL/min (ref >60)
Glucose, Bld: 84 mg/dL (ref 70–99)
Potassium: 3.7 mmol/L (ref 3.5–5.1)
Sodium: 137 mmol/L (ref 135–145)
TOTAL PROTEIN: 8.2 g/dL — AB (ref 6.5–8.1)

## 2018-09-25 LAB — D-DIMER, QUANTITATIVE: D-Dimer, Quant: 0.4 ug/mL-FEU (ref 0.00–0.50)

## 2018-09-25 LAB — PROTIME-INR
INR: 1 (ref 0.8–1.2)
Prothrombin Time: 13.6 seconds (ref 11.4–15.2)

## 2018-09-25 LAB — MAGNESIUM: Magnesium: 2 mg/dL (ref 1.7–2.4)

## 2018-09-25 LAB — TROPONIN I: Troponin I: 0.03 ng/mL (ref <0.03)

## 2018-09-25 LAB — BRAIN NATRIURETIC PEPTIDE: B Natriuretic Peptide: 7.2 pg/mL (ref 0.0–100.0)

## 2018-09-25 MED ORDER — AZITHROMYCIN 250 MG PO TABS: 250.0000 mg | ORAL_TABLET | Freq: Every day | ORAL | 0 refills | Status: DC

## 2018-09-25 MED ORDER — ASPIRIN 81 MG PO CHEW
324.0000 mg | CHEWABLE_TABLET | Freq: Once | ORAL | Status: AC
  Administered 2018-09-25: 324 mg via ORAL
  Filled 2018-09-25: qty 4

## 2018-09-25 NOTE — Telephone Encounter (Signed)
Appropriate disposition-thanks

## 2018-09-25 NOTE — Telephone Encounter (Signed)
See note

## 2018-09-25 NOTE — ED Triage Notes (Signed)
Pt from home; c/o L sided CP that began yesterday morning; described as sharp; denies sob, nausea, diaphoresis; no hx MI; worse with movement and deep inspiration; occasional radiation to back

## 2018-09-25 NOTE — ED Notes (Signed)
Patient verbalizes understanding of discharge instructions. Opportunity for questioning and answers were provided. Pt discharged from ED. 

## 2018-09-25 NOTE — Telephone Encounter (Signed)
Forwarding to Dr. Hunter as FYI.  

## 2018-09-25 NOTE — Discharge Instructions (Signed)
You were seen in the emergency department for 2 days of some left-sided chest pain.  You had blood work EKG and chest x-ray.  Your x-ray looks like you have a pneumonia at the left base.  This is likely the cause of your symptoms.  We are prescribing you antibiotics that you will need to finish.  Please drink plenty of fluids and wash her hands and do not cough on people.  All up with your doctor.  Please return to the emergency department if any worsening symptoms.

## 2018-09-25 NOTE — Telephone Encounter (Signed)
Noted  
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
## 2018-09-25 NOTE — ED Notes (Signed)
Pt's sps. Gerry Blanchfield Ph# [phone]. Pls contact w/ status and D/C

## 2018-09-25 NOTE — Telephone Encounter (Signed)
See triage encounter.

## 2018-09-25 NOTE — ED Notes (Signed)
MD aware of troponin

## 2018-09-25 NOTE — Telephone Encounter (Addendum)
Pt called stating that since yesterday he has had chest pain. He states that it is on his left side under his ribs and radiates into his back.He rates the pain at 7 today.  He denies injury. He states the pain is made worse by movement and breathing deep. He has Hx of HTN and high cholesterol. He denies nausea, SOB, sweating. Per protocol pt will go to ER for evaluation of his symptoms. Care advice read to patient. Patient verbalized understanding of all instructions.  Reason for Disposition . [1] Intermittent  chest pain or "angina" AND [2] increasing in severity or frequency  (Exception: pains lasting a few seconds)  Answer Assessment - Initial Assessment Questions 1. LOCATION: "Where does it hurt?"       Left side under ribs 2. RADIATION: "Does the pain go anywhere else?" (e.g., into neck, jaw, arms, back)     back 3. ONSET: "When did the chest pain begin?" (Minutes, hours or days)      Yesterday morning 4. PATTERN "Does the pain come and go, or has it been constant since it started?"  "Does it get worse with exertion?"      When he breaths or moves 5. DURATION: "How long does it last" (e.g., seconds, minutes, hours)     seconds 6. SEVERITY: "How bad is the pain?"  (e.g., Scale 1-10; mild, moderate, or severe)    - MILD (1-3): doesn't interfere with normal activities     - MODERATE (4-7): interferes with normal activities or awakens from sleep    - SEVERE (8-10): excruciating pain, unable to do any normal activities       Yesterday 8-10 today 7 7. CARDIAC RISK FACTORS: "Do you have any history of heart problems or risk factors for heart disease?" (e.g., prior heart attack, angina; high blood pressure, diabetes, being overweight, high cholesterol, smoking, or strong family history of heart disease)     htn cholesterol 8. PULMONARY RISK FACTORS: "Do you have any history of lung disease?"  (e.g., blood clots in lung, asthma, emphysema, birth control pills)     no 9. CAUSE: "What do you think  is causing the chest pain?"     nothing 10. OTHER SYMPTOMS: "Do you have any other symptoms?" (e.g., dizziness, nausea, vomiting, sweating, fever, difficulty breathing, cough)       Light cough every once in a while 11. PREGNANCY: "Is there any chance you are pregnant?" "When was your last menstrual period?"       N/A  Protocols used: CHEST PAIN-A-AH

## 2018-09-26 ENCOUNTER — Telehealth: Payer: Self-pay

## 2018-09-26 NOTE — Telephone Encounter (Signed)
Copied from [address] [id number]. Topic: General - Inquiry >> Sep 26, 2018  8:05 AM Scherrie Gerlach wrote: Reason for CRM:  Wife calling to report pt did go to the ED yesterday and he has a touch of PNA in his left side lung.  Pt prescribed abx.  Also instructed to make fup asap. Pt is not coughing, no congestion. Pt transferred to office to schedule appt. >> Sep 26, 2018  8:59 AM Franco Collet, CMA wrote: Spoke to pt and he stated that he just need a f/u appt. Pt stated that he feels a lot better after taking abx. While talking to pt he explains that he is fine and wants his wife to be seen in the office I asked if I could speak to pt wife and he put her on the phone.    Switching to wife chart for documentation.     >> Sep 26, 2018  9:11 AM Franco Collet, CMA wrote: Pt and wife cannot do video visit. They stated that they do not have any access to smart phone and their computer does not have a camera. They also expressed that they do not want to do a video call or telephone call because it wont help. I advised couple that we are trying to keep pt out of the office due to Covid-19 esp immunocompromised pt. Pt and wife verbalized understanding.

## 2018-09-27 ENCOUNTER — Encounter: Payer: Self-pay | Admitting: Family Medicine

## 2018-09-27 ENCOUNTER — Ambulatory Visit (INDEPENDENT_AMBULATORY_CARE_PROVIDER_SITE_OTHER): Payer: Medicare Other | Admitting: Family Medicine

## 2018-09-27 VITALS — Ht 71.0 in | Wt 192.0 lb

## 2018-09-27 DIAGNOSIS — J189 Pneumonia, unspecified organism: Secondary | ICD-10-CM | POA: Diagnosis not present

## 2018-09-27 DIAGNOSIS — E785 Hyperlipidemia, unspecified: Secondary | ICD-10-CM | POA: Diagnosis not present

## 2018-09-27 NOTE — Progress Notes (Signed)
Phone [phone]   Subjective:  Virtual visit via phone note  Our team/I connected with David Gilmore on 09/27/18 at 10:00 AM EDT by phone (patient did not have equipment for webex) and verified that I am speaking with the correct person using two identifiers.  Location patient: [place] Location provider: [facility], office Persons participating in the virtual visit: patient and wife.   Time on call: 11 minutes   Our team/I discussed the limitations of evaluation and management by telemedicine and the availability of in person appointments. In light of current covid-19 pandemic, patient also understands that we are trying to protect them by minimizing in office contact if at all possible.  The patient expressed consent for telemedicine visit and agreed to proceed.   ROS- chest pain improving, shortness of breath only with activity. Has not had any fever and has not had one.   Past Medical History-  Patient Active Problem List   Diagnosis Date Noted  . PAD (peripheral artery disease) ([place]) 09/19/2018    Priority: High  . Mallory-Weiss tear     Priority: High  . History of heart failure     Priority: High  . Anemia 08/23/2017    Priority: High  . BPH associated with nocturia 08/16/2017    Priority: Medium  . Shortness of breath 08/16/2017    Priority: Medium  . Essential hypertension 06/22/2015    Priority: Medium  . Hyperlipidemia     Priority: Medium  . Aortic atherosclerosis ([place]) 08/16/2017    Priority: Low  . Abnormal stress test 07/27/2015    Priority: Low  . Former smoker 06/15/2015    Priority: Low    Medications- reviewed and updated Current Outpatient Medications  Medication Sig Dispense Refill  . amLODipine (NORVASC) 5 MG tablet TAKE 1 TABLET BY MOUTH ONCE DAILY 90 tablet 0  . aspirin 81 MG tablet Take 81 mg by mouth daily.    Marland Kitchen azithromycin (ZITHROMAX) 250 MG tablet Take 1 tablet (250 mg total) by mouth daily. Take first 2 tablets together, then 1  every day until finished. 6 tablet 0  . diltiazem (TIAZAC) 120 MG 24 hr capsule Take 120 mg by mouth daily.  1  . hydrochlorothiazide (HYDRODIURIL) 25 MG tablet TAKE 1 TABLET BY MOUTH ONCE DAILY 90 tablet 0  . Multiple Vitamins-Minerals (CENTRUM SILVER ADULT 50+) TABS Take by mouth.    . rosuvastatin (CRESTOR) 40 MG tablet Take 1 tablet (40 mg total) by mouth daily. 90 tablet 3  . sildenafil (REVATIO) 20 MG tablet Take 2-5 tablets as needed once every 48 hours for erectile dysfunction 50 tablet 3   No current facility-administered medications for this visit.      Objective:  Ht 5\' 11"  (1.803 m)   Wt 192 lb (87.1 kg)   BMI 26.78 kg/m  No obvious distress noted by phone. No obvious labored breathing Normal speech    Assessment and Plan   #Pneumonia/chest pain follow-up S: Patient seen in ER on 09/25/2018 for chest pain- was found to have pneumonia. Started on azithromycin and symptoms are improving. Minimal cough. If he takes a deep breath feels a pain in chest- but states steadily improving. Usually hurts when takes a deep breath the pain is top of rib cage- before it went all the way to bottom of rib cage going down to back- so has much improved.   Not short of breath at all today. Yesterday was running to get wheelchair for wife and felt slightly winded.  No swelling in his legs. No fever reported.   Blood pressure has been ok on recent visits - he reports went down before leaving the hospital. Compliant with hctz 25 mg, diltaizem 120mg  XR, amlodipien 5 mg.   A/P: Given improvement in symptoms on azithromycin-I do suspect that pneumonia is the cause of his symptoms.  He has no exertional chest pain with this- doubt cardiac cause (ekg and troponin in the emergency room were reassuring as well) -He asks what we would do if he continued to have issues by next week when he finishes azithromycin--doxycycline if needed (PCN allergy reported- buzzin in head- he is just cautious) -He knows if  he has new or worsening symptoms he should seek care immediately.  Hyperlipidemia #hyperlipidemia S: Hopefully improving controlled on Crestor 40 mg up from 20 mg Lab Results  Component Value Date   CHOL 142 09/18/2018   HDL 35.50 (L) 09/18/2018   LDLCALC 86 09/18/2018   LDLDIRECT 60.0 10/29/2015   TRIG 105.0 09/18/2018   CHOLHDL 4 09/18/2018   A/P: Hopefully improving. Continue current medications.  Likely recheck cholesterol when able in relation to of COVID-19 pandemic   Return precautions advised.  [name], MD

## 2018-09-27 NOTE — Assessment & Plan Note (Signed)
#  hyperlipidemia S: Hopefully improving controlled on Crestor 40 mg up from 20 mg Lab Results  Component Value Date   CHOL 142 09/18/2018   HDL 35.50 (L) 09/18/2018   LDLCALC 86 09/18/2018   LDLDIRECT 60.0 10/29/2015   TRIG 105.0 09/18/2018   CHOLHDL 4 09/18/2018   A/P: Hopefully improving. Continue current medications.  Likely recheck cholesterol when able in relation to of COVID-19 pandemic

## 2018-09-27 NOTE — Patient Instructions (Addendum)
Phone visit due to COVID-19 pandemic  Patient knows if he has new or worsening symptoms to seek care immediately

## 2018-10-12 ENCOUNTER — Other Ambulatory Visit: Payer: Self-pay | Admitting: Cardiology

## 2018-11-29 ENCOUNTER — Other Ambulatory Visit: Payer: Self-pay | Admitting: Family Medicine

## 2018-12-08 ENCOUNTER — Other Ambulatory Visit: Payer: Self-pay | Admitting: Family Medicine

## 2018-12-20 ENCOUNTER — Encounter: Payer: Self-pay | Admitting: Family Medicine

## 2018-12-20 ENCOUNTER — Ambulatory Visit (INDEPENDENT_AMBULATORY_CARE_PROVIDER_SITE_OTHER): Payer: Medicare Other

## 2018-12-20 ENCOUNTER — Ambulatory Visit (INDEPENDENT_AMBULATORY_CARE_PROVIDER_SITE_OTHER): Payer: Medicare Other | Admitting: Family Medicine

## 2018-12-20 ENCOUNTER — Other Ambulatory Visit: Payer: Self-pay

## 2018-12-20 VITALS — BP 132/84 | HR 91 | Temp 98.3°F | Ht 71.0 in | Wt 190.6 lb

## 2018-12-20 DIAGNOSIS — R918 Other nonspecific abnormal finding of lung field: Secondary | ICD-10-CM

## 2018-12-20 DIAGNOSIS — D509 Iron deficiency anemia, unspecified: Secondary | ICD-10-CM | POA: Diagnosis not present

## 2018-12-20 DIAGNOSIS — I700 Atherosclerosis of aorta: Secondary | ICD-10-CM

## 2018-12-20 DIAGNOSIS — R351 Nocturia: Secondary | ICD-10-CM | POA: Diagnosis not present

## 2018-12-20 DIAGNOSIS — I10 Essential (primary) hypertension: Secondary | ICD-10-CM

## 2018-12-20 DIAGNOSIS — E785 Hyperlipidemia, unspecified: Secondary | ICD-10-CM | POA: Diagnosis not present

## 2018-12-20 LAB — COMPREHENSIVE METABOLIC PANEL
ALT: 22 U/L (ref 0–53)
AST: 22 U/L (ref 0–37)
Albumin: 4.4 g/dL (ref 3.5–5.2)
Alkaline Phosphatase: 56 U/L (ref 39–117)
BUN: 11 mg/dL (ref 6–23)
CO2: 31 mEq/L (ref 19–32)
Calcium: 10.4 mg/dL (ref 8.4–10.5)
Chloride: 101 mEq/L (ref 96–112)
Creatinine, Ser: 1.08 mg/dL (ref 0.40–1.50)
GFR: 81.99 mL/min (ref >60.00)
Glucose, Bld: 64 mg/dL — ABNORMAL LOW (ref 70–99)
Potassium: 3.7 mEq/L (ref 3.5–5.1)
Sodium: 139 mEq/L (ref 135–145)
Total Bilirubin: 0.5 mg/dL (ref 0.2–1.2)
Total Protein: 7.2 g/dL (ref 6.0–8.3)

## 2018-12-20 LAB — CBC WITH DIFFERENTIAL/PLATELET
Basophils Absolute: 0 10*3/uL (ref 0.0–0.1)
Basophils Relative: 0.3 % (ref 0.0–3.0)
Eosinophils Absolute: 0.1 10*3/uL (ref 0.0–0.7)
Eosinophils Relative: 2.6 % (ref 0.0–5.0)
HCT: 43.2 % (ref 39.0–52.0)
Hemoglobin: 14.5 g/dL (ref 13.0–17.0)
Lymphocytes Relative: 33 % (ref 12.0–46.0)
Lymphs Abs: 1.3 10*3/uL (ref 0.7–4.0)
MCHC: 33.5 g/dL (ref 30.0–36.0)
MCV: 80.8 fl (ref 78.0–100.0)
Monocytes Absolute: 0.6 10*3/uL (ref 0.1–1.0)
Monocytes Relative: 14.9 % — ABNORMAL HIGH (ref 3.0–12.0)
Neutro Abs: 1.9 10*3/uL (ref 1.4–7.7)
Neutrophils Relative %: 49.2 % (ref 43.0–77.0)
Platelets: 224 10*3/uL (ref 150.0–400.0)
RBC: 5.34 Mil/uL (ref 4.22–5.81)
RDW: 14.8 % (ref 11.5–15.5)
WBC: 4 10*3/uL (ref 4.0–10.5)

## 2018-12-20 LAB — IBC + FERRITIN
Ferritin: 22.8 ng/mL (ref 22.0–322.0)
Iron: 92 ug/dL (ref 42–165)
Saturation Ratios: 19 % — ABNORMAL LOW (ref 20.0–50.0)
Transferrin: 346 mg/dL (ref 212.0–360.0)

## 2018-12-20 LAB — LDL CHOLESTEROL, DIRECT: Direct LDL: 66 mg/dL

## 2018-12-20 LAB — PSA: PSA: 2.63 ng/mL (ref 0.10–4.00)

## 2018-12-20 NOTE — Progress Notes (Signed)
Phone [phone]   Subjective:  David Gilmore is a 68 y.o. year old very pleasant male patient who presents for/with See problem oriented charting Chief Complaint  Patient presents with  . Follow-up  . Hypertension  . Hyperlipidemia  . Anemia   ROS-  No fever, chills, cough, shortness of breath, body aches, sore throat, or loss of taste or smell. No claudication   Past Medical History-  Patient Active Problem List   Diagnosis Date Noted  . PAD (peripheral artery disease) ([place]) 09/19/2018    Priority: High  . Mallory-Weiss tear     Priority: High  . History of heart failure     Priority: High  . Anemia 08/23/2017    Priority: High  . BPH associated with nocturia 08/16/2017    Priority: Medium  . Shortness of breath 08/16/2017    Priority: Medium  . Essential hypertension 06/22/2015    Priority: Medium  . Hyperlipidemia     Priority: Medium  . Aortic atherosclerosis ([place]) 08/16/2017    Priority: Low  . Abnormal stress test 07/27/2015    Priority: Low  . Former smoker 06/15/2015    Priority: Low    Medications- reviewed and updated Current Outpatient Medications  Medication Sig Dispense Refill  . amLODipine (NORVASC) 5 MG tablet Take 1 tablet by mouth once daily 90 tablet 0  . aspirin 81 MG tablet Take 81 mg by mouth daily.    Marland Kitchen diltiazem (TIAZAC) 120 MG 24 hr capsule Take 1 capsule by mouth once daily 90 capsule 0  . ferrous sulfate 325 (65 FE) MG tablet Take 325 mg by mouth every other day.    . hydrochlorothiazide (HYDRODIURIL) 25 MG tablet Take 1 tablet by mouth once daily 90 tablet 0  . Multiple Vitamins-Minerals (CENTRUM SILVER ADULT 50+) TABS Take by mouth.    . rosuvastatin (CRESTOR) 40 MG tablet Take 1 tablet (40 mg total) by mouth daily. 90 tablet 3  . sildenafil (REVATIO) 20 MG tablet Take 2-5 tablets as needed once every 48 hours for erectile dysfunction 50 tablet 3   No current facility-administered medications for this visit.      Objective:   BP 132/84 (BP Location: Left Arm, Patient Position: Sitting, Cuff Size: Normal)   Pulse 91   Temp 98.3 F (36.8 C) (Oral)   Ht 5\' 11"  (1.803 m)   Wt 190 lb 9.6 oz (86.5 kg)   SpO2 98%   BMI 26.58 kg/m  Gen: NAD, resting comfortably CV: RRR no murmurs rubs or gallops Lungs: CTAB no crackles, wheeze, rhonchi Abdomen: soft/nontender/nondistended Ext: no edema Skin: warm, dry Neuro: normal gait and speech    Assessment and Plan    #hypertension S: controlled on  Amlodipine 5 mg, HCT 25 mg, and Diltiazem 120 mg daily. Not checking BP at home. Avoiding excess salt intake, adding salt to foods. Has been eating less since the pandemic, unable to go to [facility]. Denies HA, dizziness, visual changes, CP, SOB. Has not missed any doses of medication.  BP Readings from Last 3 Encounters:  12/20/18 132/84  09/25/18 (!) 154/101  09/18/18 138/88  A/P: Stable. Continue current medications.   #hyperlipidemia/aortic atherosclerosis-noted on prior imaging/PAD S:  controlled on Rosuvastatin 40 mg daily up from 20 mg to try to get LDL under 70. Denies myalgia or other side effects.   Patient with PAD on imaging but remains asymptomatic with no claudication-patient compliant with aspirin.  Lab Results  Component Value Date   CHOL 142  09/18/2018   HDL 35.50 (L) 09/18/2018   LDLCALC 86 09/18/2018   LDLDIRECT 60.0 10/29/2015   TRIG 105.0 09/18/2018   CHOLHDL 4 09/18/2018   A/P: Hopefully controlled-update direct LDL today For aortic atherosclerosis-continue risk factor modification  # Anemia-iron deficiency S:Taking Ferrous Sulfate every other day. Denies constipation. Denies abnormal bruising or bleeding.  Was not anemic on last check but ferritin levels were below 20.  Up-to-date on colonoscopy- last in 2017 A/P: We will monitor today-has been stable though ferritin low- check CBC and ferritin levels  #Nocturia- we will trend PSA through age 87. Nocturia but drinks a lot of fluids in  evening.  Lab Results  Component Value Date   PSA 2.40 08/16/2017   # prior pneumonia/infiltrate on x-ray-symptoms resolved with azithromycin.  With history of smoking 1 to be on the safe side and make sure clearance of infiltrate- we will get x-ray to make sure left basilar infiltrate resolved  Lab/Order associations:   ICD-10-CM   1. Essential hypertension  I10 CBC with Differential/Platelet    Comprehensive metabolic panel  2. Hyperlipidemia, unspecified hyperlipidemia type  E78.5 LDL cholesterol, direct    Comprehensive metabolic panel  3. Iron deficiency anemia, unspecified iron deficiency anemia type  D50.9 IBC + Ferritin    CBC with Differential/Platelet  4. Aortic atherosclerosis (HCC)  I70.0   5. Nocturia  R35.1 PSA   Return precautions advised.  [name], MD

## 2018-12-20 NOTE — Patient Instructions (Addendum)
Glad you are doing well  Please stop by lab before you go If you do not have mychart- we will call you about results within 5 business days of [place] receiving them.  If you have mychart- we will send your results within 3 business days of [place] receiving them.  If abnormal or we want to clarify a result, we will call or mychart you to make sure you receive the message.  If you have questions or concerns or don't hear within 5-7 days, please send [place] a message or call us.

## 2019-01-23 ENCOUNTER — Other Ambulatory Visit: Payer: Self-pay | Admitting: Cardiology

## 2019-01-23 DIAGNOSIS — I739 Peripheral vascular disease, unspecified: Secondary | ICD-10-CM

## 2019-01-23 NOTE — Telephone Encounter (Signed)
You can refill but make an OV please

## 2019-01-23 NOTE — Telephone Encounter (Signed)
Please fill

## 2019-01-25 ENCOUNTER — Telehealth: Payer: Self-pay

## 2019-02-14 ENCOUNTER — Ambulatory Visit: Payer: Self-pay

## 2019-02-14 ENCOUNTER — Other Ambulatory Visit: Payer: Self-pay

## 2019-02-14 DIAGNOSIS — R918 Other nonspecific abnormal finding of lung field: Secondary | ICD-10-CM

## 2019-02-14 NOTE — Progress Notes (Signed)
Dg chest   

## 2019-02-14 NOTE — Telephone Encounter (Signed)
Patient states that he would like to wait until next exam is due. No longer having issues with breathing since he started new medication.

## 2019-02-14 NOTE — Telephone Encounter (Signed)
Please tell him I am not repeating the chest x-ray based off concern about his breathing-I want to make sure that the area/spot that we thought was related to prior infection has cleared since it did not clear completely on the last x-ray.  If it does not clear on x-ray- we may need to consider CT scanning

## 2019-02-14 NOTE — Telephone Encounter (Signed)
FYI regarding recommendation to repeat CXR d/u opacity.

## 2019-02-14 NOTE — Telephone Encounter (Signed)
See note

## 2019-02-15 NOTE — Telephone Encounter (Signed)
Pt notified of Dr. Ronney Lion concerns. PT has been scheduled to come in Monday 02/18/2019. No further action needed at this time.

## 2019-02-18 ENCOUNTER — Other Ambulatory Visit: Payer: Medicare Other

## 2019-02-18 ENCOUNTER — Ambulatory Visit (INDEPENDENT_AMBULATORY_CARE_PROVIDER_SITE_OTHER): Payer: Medicare Other

## 2019-02-18 ENCOUNTER — Other Ambulatory Visit: Payer: Self-pay

## 2019-02-18 DIAGNOSIS — R918 Other nonspecific abnormal finding of lung field: Secondary | ICD-10-CM

## 2019-02-18 DIAGNOSIS — J8410 Pulmonary fibrosis, unspecified: Secondary | ICD-10-CM | POA: Diagnosis not present

## 2019-03-01 ENCOUNTER — Other Ambulatory Visit: Payer: Self-pay | Admitting: Cardiology

## 2019-03-01 DIAGNOSIS — I739 Peripheral vascular disease, unspecified: Secondary | ICD-10-CM

## 2019-03-18 ENCOUNTER — Other Ambulatory Visit: Payer: Self-pay | Admitting: Family Medicine

## 2019-03-29 IMAGING — DX DG CHEST 2V
2 series · 2 of 2 positions shown · non-contrast
Comparison: None in PACs

CLINICAL DATA: Intermittent exertional shortness of breath and
fatigue for the past year. Former smoker. Hypertension.

EXAM:
CHEST  2 VIEW

[chest pa]
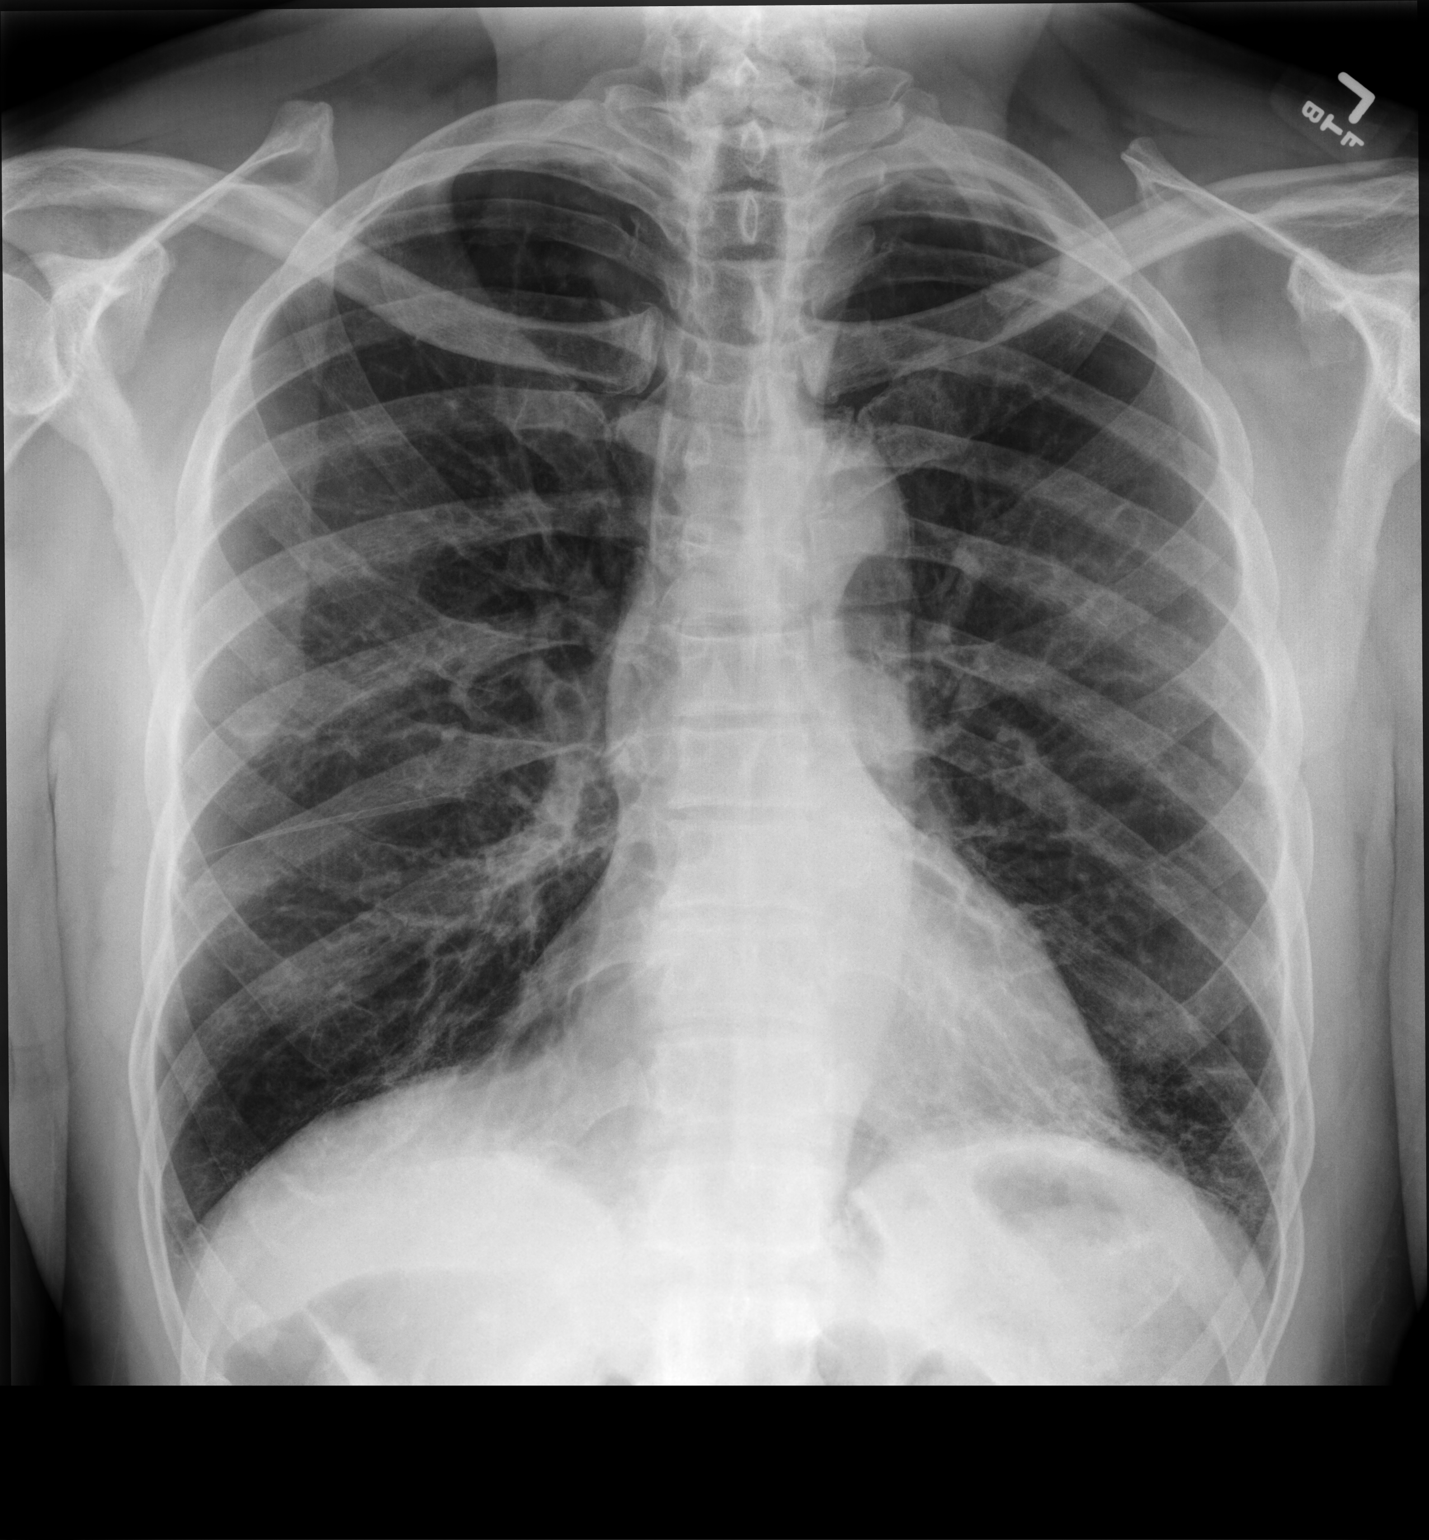

[chest lat]
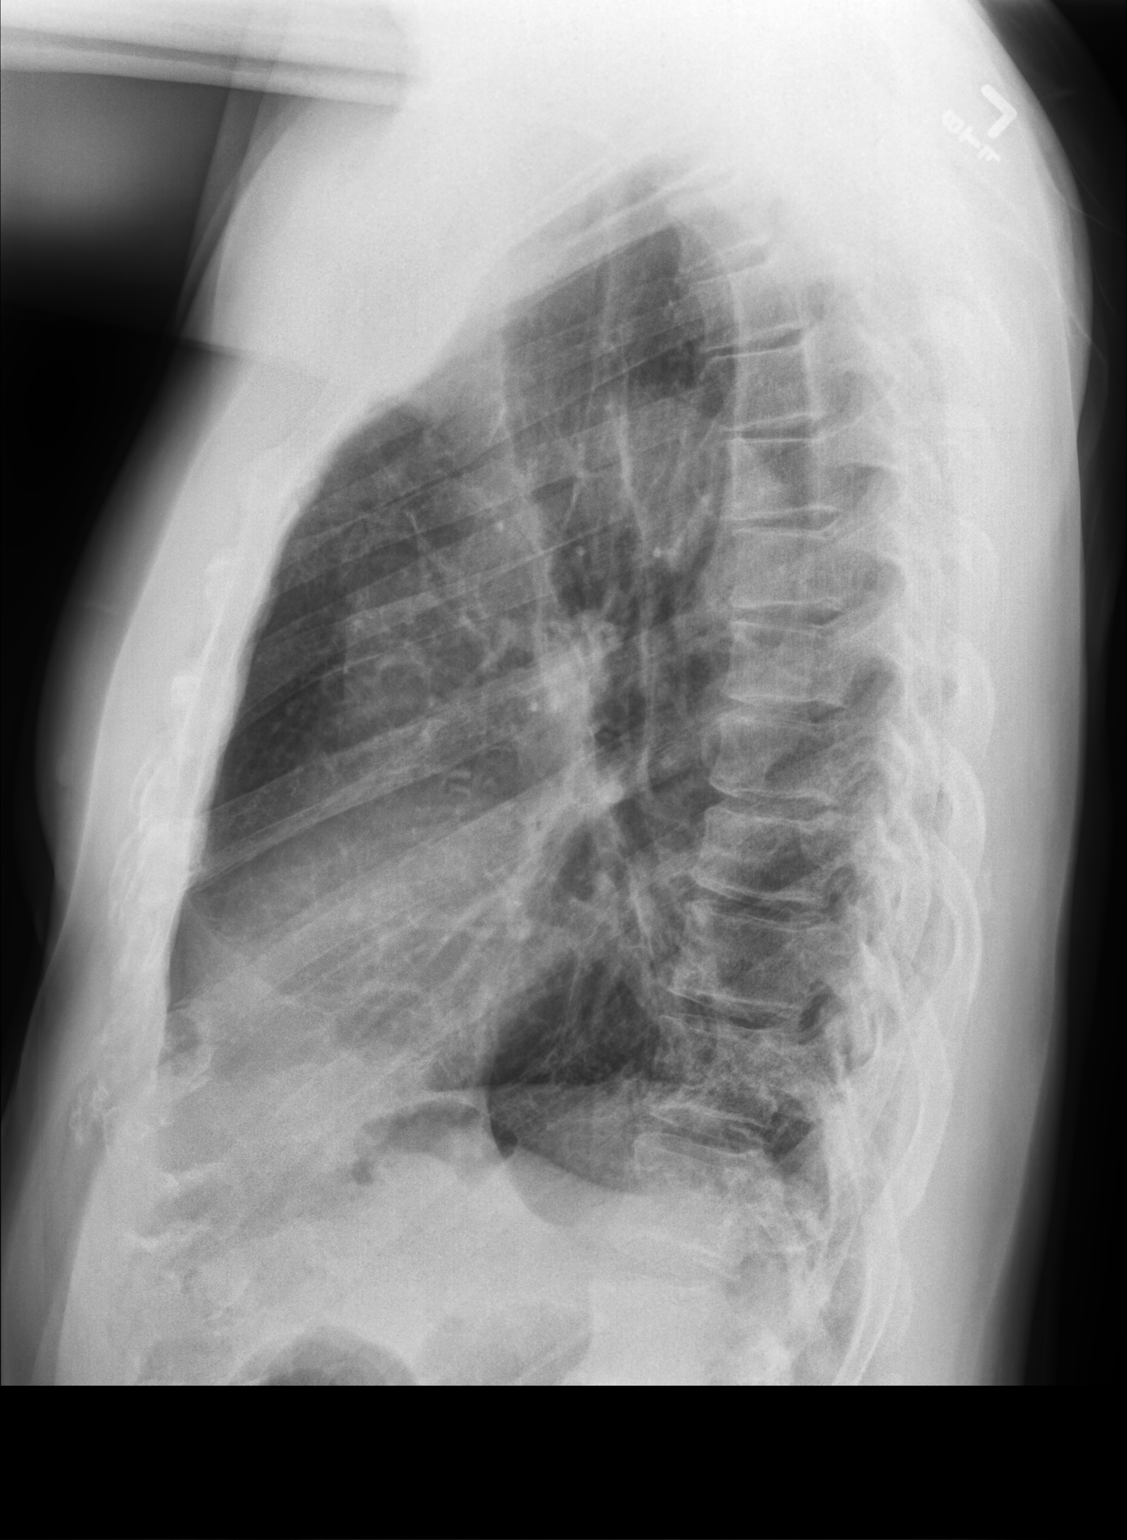

[2 of 2 positions shown; findings below may reference images not displayed]

FINDINGS: The lungs are well-expanded. The interstitial markings are coarse.
There is no alveolar infiltrate or pleural effusion. The heart and
pulmonary vascularity are normal. The mediastinum is normal in
width. There is no pleural effusion. The bony thorax is
unremarkable.
IMPRESSION: Chronic bronchitic-smoking related changes. No acute pneumonia nor
CHF.

## 2019-04-08 ENCOUNTER — Other Ambulatory Visit: Payer: Self-pay | Admitting: Family Medicine

## 2019-06-13 ENCOUNTER — Telehealth: Payer: Self-pay | Admitting: Family Medicine

## 2019-06-13 NOTE — Telephone Encounter (Signed)
I left a message asking the patient to call and schedule Medicare AWV with [name] ([place]) on 06/21/2019 after seeing Dr. Yong Channel.  I'm waiting for a call back to either confirm or decline the appointment. If patient calls back, please update appointment notes.  VDM ([place])

## 2019-06-21 ENCOUNTER — Ambulatory Visit (INDEPENDENT_AMBULATORY_CARE_PROVIDER_SITE_OTHER): Payer: Medicare Other | Admitting: Family Medicine

## 2019-06-21 ENCOUNTER — Ambulatory Visit: Payer: Medicare Other

## 2019-06-21 ENCOUNTER — Encounter: Payer: Self-pay | Admitting: Family Medicine

## 2019-06-21 VITALS — BP 138/88 | HR 93 | Temp 97.6°F | Ht 71.0 in | Wt 193.4 lb

## 2019-06-21 DIAGNOSIS — E785 Hyperlipidemia, unspecified: Secondary | ICD-10-CM

## 2019-06-21 DIAGNOSIS — I10 Essential (primary) hypertension: Secondary | ICD-10-CM | POA: Diagnosis not present

## 2019-06-21 DIAGNOSIS — D509 Iron deficiency anemia, unspecified: Secondary | ICD-10-CM

## 2019-06-21 DIAGNOSIS — I739 Peripheral vascular disease, unspecified: Secondary | ICD-10-CM | POA: Diagnosis not present

## 2019-06-21 LAB — CBC WITH DIFFERENTIAL/PLATELET
Basophils Absolute: 0.1 10*3/uL (ref 0.0–0.1)
Basophils Relative: 1 % (ref 0.0–3.0)
Eosinophils Absolute: 0.2 10*3/uL (ref 0.0–0.7)
Eosinophils Relative: 3.3 % (ref 0.0–5.0)
HCT: 42.9 % (ref 39.0–52.0)
Hemoglobin: 14.3 g/dL (ref 13.0–17.0)
Lymphocytes Relative: 31.3 % (ref 12.0–46.0)
Lymphs Abs: 1.6 10*3/uL (ref 0.7–4.0)
MCHC: 33.2 g/dL (ref 30.0–36.0)
MCV: 82.1 fl (ref 78.0–100.0)
Monocytes Absolute: 0.6 10*3/uL (ref 0.1–1.0)
Monocytes Relative: 11.9 % (ref 3.0–12.0)
Neutro Abs: 2.7 10*3/uL (ref 1.4–7.7)
Neutrophils Relative %: 52.5 % (ref 43.0–77.0)
Platelets: 218 10*3/uL (ref 150.0–400.0)
RBC: 5.23 Mil/uL (ref 4.22–5.81)
RDW: 13.8 % (ref 11.5–15.5)
WBC: 5.1 10*3/uL (ref 4.0–10.5)

## 2019-06-21 LAB — COMPREHENSIVE METABOLIC PANEL
ALT: 24 U/L (ref 0–53)
AST: 23 U/L (ref 0–37)
Albumin: 4.2 g/dL (ref 3.5–5.2)
Alkaline Phosphatase: 57 U/L (ref 39–117)
BUN: 14 mg/dL (ref 6–23)
CO2: 35 mEq/L — ABNORMAL HIGH (ref 19–32)
Calcium: 10.1 mg/dL (ref 8.4–10.5)
Chloride: 100 mEq/L (ref 96–112)
Creatinine, Ser: 1.2 mg/dL (ref 0.40–1.50)
GFR: 72.49 mL/min (ref >60.00)
Glucose, Bld: 80 mg/dL (ref 70–99)
Potassium: 3.4 mEq/L — ABNORMAL LOW (ref 3.5–5.1)
Sodium: 140 mEq/L (ref 135–145)
Total Bilirubin: 0.6 mg/dL (ref 0.2–1.2)
Total Protein: 7.2 g/dL (ref 6.0–8.3)

## 2019-06-21 LAB — IBC + FERRITIN
Ferritin: 19.2 ng/mL — ABNORMAL LOW (ref 22.0–322.0)
Iron: 60 ug/dL (ref 42–165)
Saturation Ratios: 13.1 % — ABNORMAL LOW (ref 20.0–50.0)
Transferrin: 326 mg/dL (ref 212.0–360.0)

## 2019-06-21 MED ORDER — DILTIAZEM HCL ER BEADS 120 MG PO CP24: ORAL_CAPSULE | ORAL | 3 refills | Status: DC

## 2019-06-21 NOTE — Patient Instructions (Addendum)
Please stop by lab before you go If you do not have mychart- we will call you about results within 5 business days of [place] receiving them.  If you have mychart- we will send your results within 3 business days of [place] receiving them.  If abnormal or we want to clarify a result, we will call or mychart you to make sure you receive the message.  If you have questions or concerns or don't hear within 5-7 days, please send [place] a message or call us.   Blood pressure high normal but still ok- I still think it would be better back on the diltiazem so I sent this in for you. Please start this back and give [place] a call with how home #s are looking once you restart. Ideally we will keep you between 110-140/60-85.   Likely need to restart iron- lets check iron levels today   If you would like to schedule a wellness visit with [name] by phone- can do that before you leave as well   Recommended follow up: Return in about 6 months (around 12/20/2019) for follow up- or sooner if needed.

## 2019-06-21 NOTE — Progress Notes (Signed)
Phone [phone] In person visit   Subjective:   David Gilmore is a 68 y.o. year old very pleasant male patient who presents for/with See problem oriented charting Chief Complaint  Patient presents with  . Follow-up    ROS- Review of Systems  Constitutional: Negative.   HENT: Negative.   Eyes: Negative.   Respiratory: Negative.   Cardiovascular: Negative.   Gastrointestinal: Negative.   Genitourinary: Negative.   Musculoskeletal: Negative.   Skin: Negative.   Neurological: Negative.   Endo/Heme/Allergies: Negative.   Psychiatric/Behavioral: Negative.      This visit occurred during the SARS-CoV-2 public health emergency.  Safety protocols were in place, including screening questions prior to the visit, additional usage of staff PPE, and extensive cleaning of exam room while observing appropriate contact time as indicated for disinfecting solutions.   Past Medical History-  Patient Active Problem List   Diagnosis Date Noted  . PAD (peripheral artery disease) ([place]) 09/19/2018    Priority: High  . Mallory-Weiss tear     Priority: High  . History of heart failure     Priority: High  . Anemia 08/23/2017    Priority: High  . BPH associated with nocturia 08/16/2017    Priority: Medium  . Shortness of breath 08/16/2017    Priority: Medium  . Essential hypertension 06/22/2015    Priority: Medium  . Hyperlipidemia     Priority: Medium  . Aortic atherosclerosis ([place]) 08/16/2017    Priority: Low  . Abnormal stress test 07/27/2015    Priority: Low  . Former smoker 06/15/2015    Priority: Low    Medications- reviewed and updated Current Outpatient Medications  Medication Sig Dispense Refill  . amLODipine (NORVASC) 5 MG tablet Take 1 tablet by mouth once daily 90 tablet 1  . aspirin 81 MG tablet Take 81 mg by mouth daily.    . cholecalciferol (VITAMIN D3) 25 MCG (1000 UT) tablet Take 1,000 Units by mouth daily.    Marland Kitchen diltiazem (TIAZAC) 120 MG 24 hr capsule TAKE  1 CAPSULE BY MOUTH ONCE DAILY 90 capsule 3  . hydrochlorothiazide (HYDRODIURIL) 25 MG tablet Take 1 tablet by mouth once daily 90 tablet 0  . rosuvastatin (CRESTOR) 40 MG tablet Take 1 tablet (40 mg total) by mouth daily. 90 tablet 3  . sildenafil (REVATIO) 20 MG tablet Take 2-5 tablets as needed once every 48 hours for erectile dysfunction 50 tablet 3   No current facility-administered medications for this visit.     Objective:  BP 138/88   Pulse 93   Temp 97.6 F (36.4 C) (Temporal)   Ht 5\' 11"  (1.803 m)   Wt 193 lb 6.4 oz (87.7 kg)   SpO2 95%   BMI 26.97 kg/m  Gen: NAD, resting comfortably CV: RRR no murmurs rubs or gallops Lungs: CTAB no crackles, wheeze, rhonchi Abdomen: soft/nontender/nondistended/normal bowel sounds.  Ext: no edema, weak DP pulses, do not feel PT pulses Skin: warm, dry Neuro: grossly normal, moves all extremities     Assessment and Plan  #hypertension S: typically controlled on  Amlodipine 5 mg, HCT 25 mg.  Does have cuff but never checks. He denies any chest pain, SOB, swelling of feet or legs and no headaches.   BP Readings from Last 3 Encounters:  06/21/19 138/88  12/20/18 132/84  09/25/18 (!) 154/101  A/P: From avs "Blood pressure high normal but still ok- I still think it would be better back on the diltiazem so I sent this in for  you. Please start this back and give [place] a call with how home #s are looking once you restart. Ideally we will keep you between 110-140/60-85.  "  #Peripheral arterial disease hyperlipidemia/aortic atherosclerosis-noted on prior imaging/PAD S:   Hopefully controlled on Rosuvastatin 40 mg-increased in March. Denies any muscles aches.   Patient with PAD on imaging but remains asymptomatic with no claudication-patient compliant with aspirin.  A/P: LDL was under 70 and at goal in June-continue current medication.  PAD appears stable-continue aspirin.  For aortic atherosclerosis-continue risk factor modification. From exam  " weak DP pulses, do not feel PT pulses"  # Anemia-iron deficiency S:Patient has not been taking iron supplement- probably stopped around September.   At last visit in March was taking every other day and ferritin levels were improving slightly.  Anemia may have been caused by H. pylori and ulceration but also had small bowel AVMs.  He finished quadruple treatment for that last year and is no longer on PPI either. A/P: Hopefully not worsening off iron-update ferritin levels today as well as CBC.  With PAD history I do not feel strongly about stopping aspirin  Recommended follow up: Return in about 6 months (around 12/20/2019) for follow up- or sooner if needed.  Lab/Order associations:   ICD-10-CM   1. Essential hypertension  I10 CBC with Differential    Comprehensive metabolic panel  2. Hyperlipidemia, unspecified hyperlipidemia type  E78.5   3. Iron deficiency anemia, unspecified iron deficiency anemia type  D50.9 Ferritin (new as of 08/07/2018)  4. PAD (peripheral artery disease) (HCC)  I73.9     Meds ordered this encounter  Medications  . diltiazem (TIAZAC) 120 MG 24 hr capsule    Sig: TAKE 1 CAPSULE BY MOUTH ONCE DAILY    Dispense:  90 capsule    Refill:  3    Return precautions advised.  [name], MD

## 2019-07-09 ENCOUNTER — Ambulatory Visit: Payer: Medicare Other | Attending: Internal Medicine

## 2019-07-09 DIAGNOSIS — Z20822 Contact with and (suspected) exposure to covid-19: Secondary | ICD-10-CM

## 2019-07-11 LAB — NOVEL CORONAVIRUS, NAA: SARS-CoV-2, NAA: NOT DETECTED

## 2019-07-12 ENCOUNTER — Telehealth: Payer: Self-pay | Admitting: *Deleted

## 2019-07-12 NOTE — Telephone Encounter (Signed)
Patient called given negative ,covid results.

## 2019-07-15 ENCOUNTER — Other Ambulatory Visit: Payer: Self-pay | Admitting: Family Medicine

## 2019-08-01 IMAGING — DX DG CHEST 2V
2 series · 2 of 2 positions shown · non-contrast
Comparison: Chest x-ray of November 21, 2017

CLINICAL DATA: No current complaints. Evaluate patient for
esophageal clamps. Patient has history of [place] tear.

EXAM:
CHEST - 2 VIEW

[chest pa]
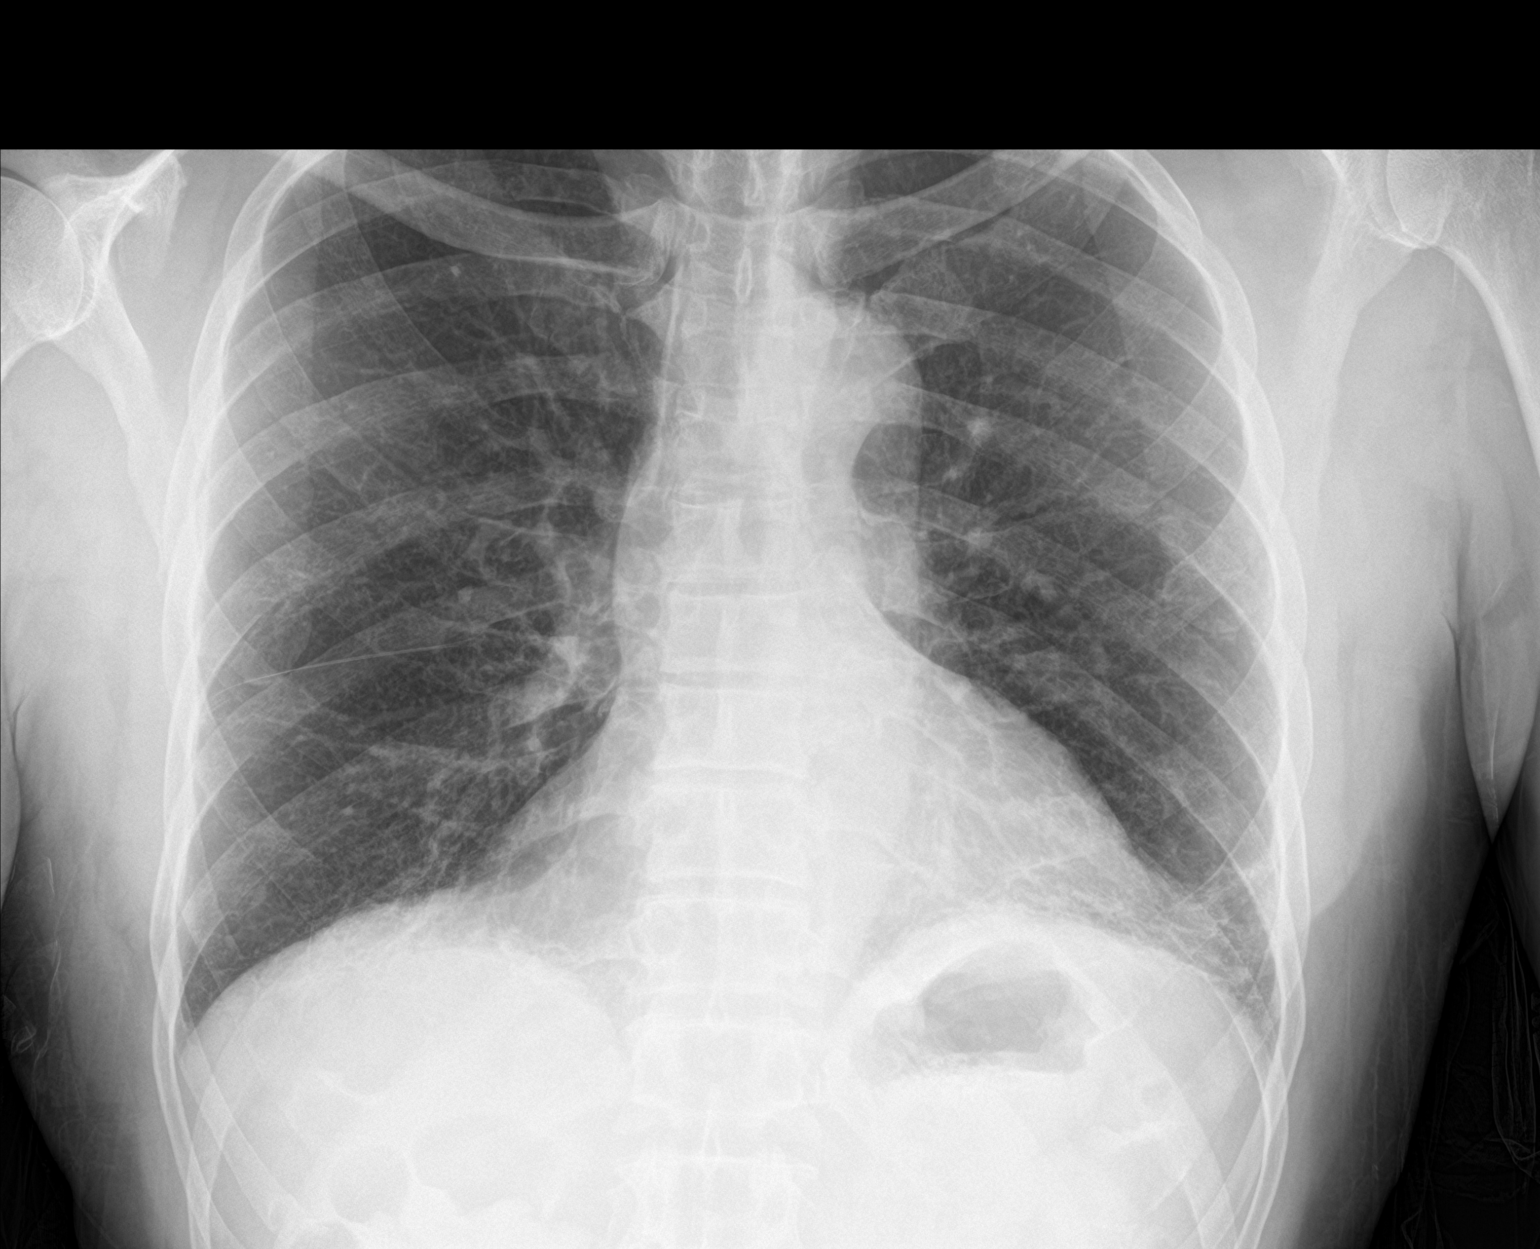

[chest lat]
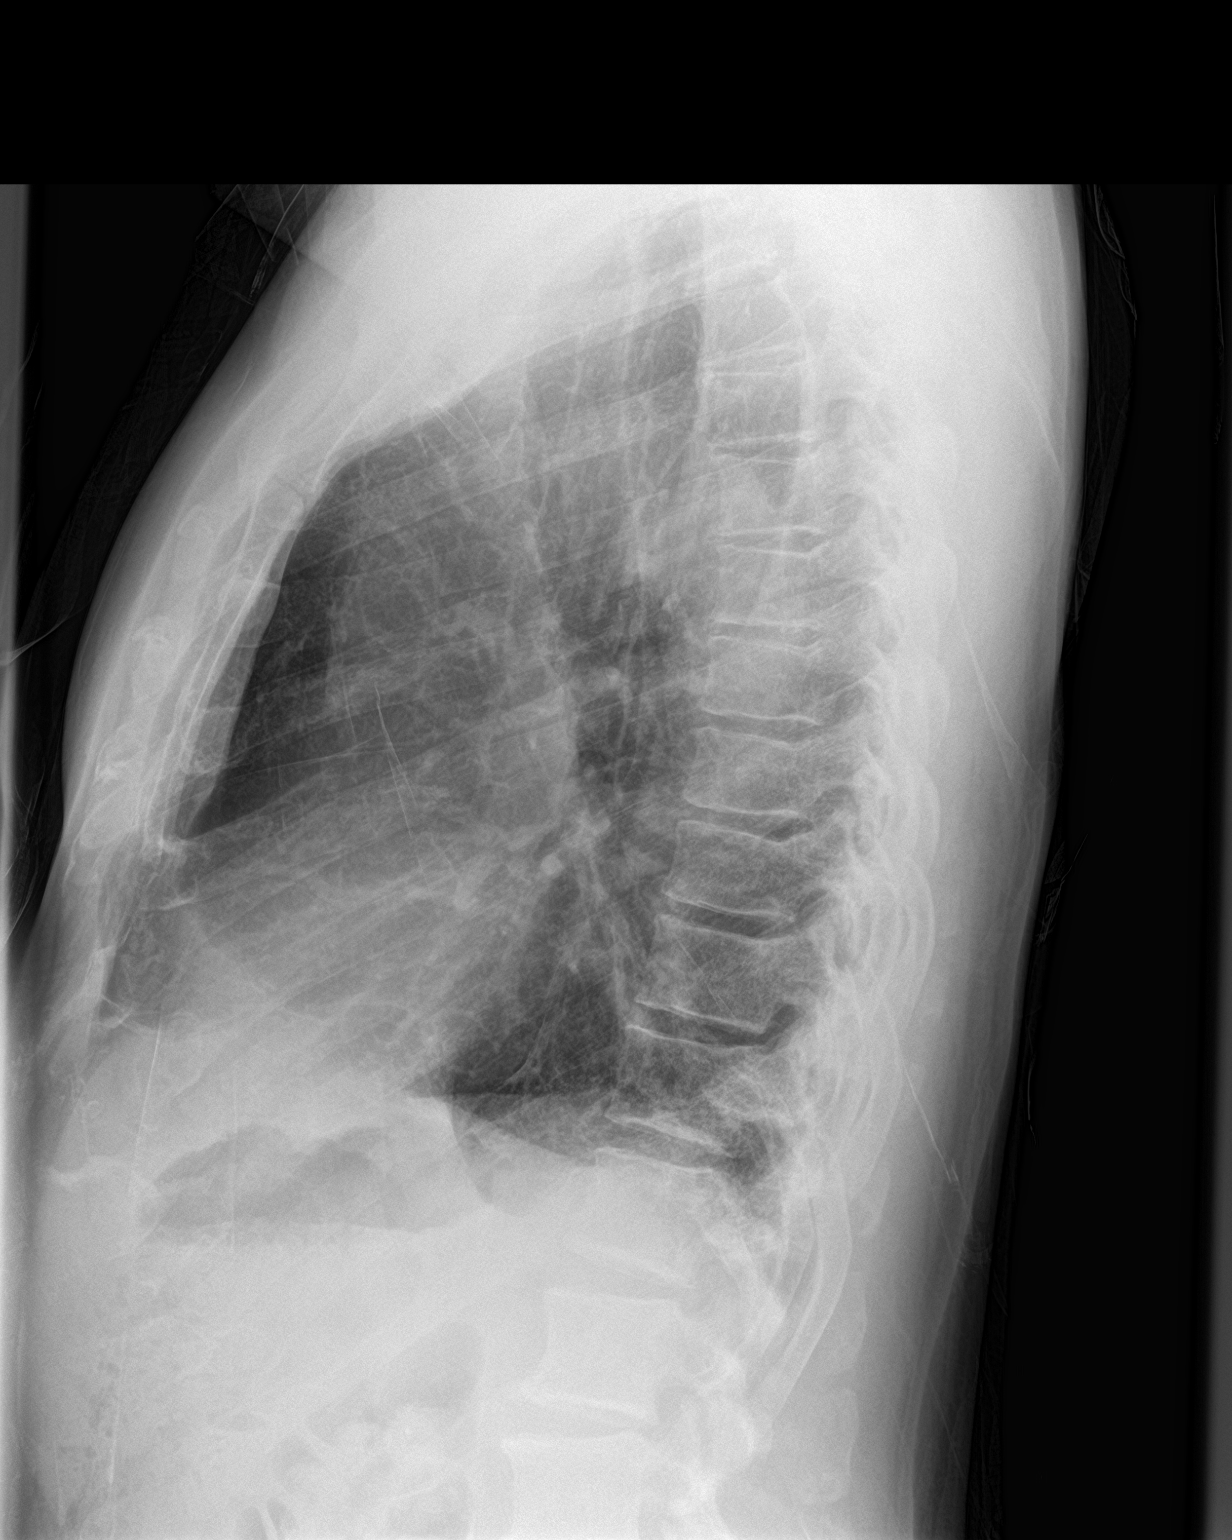

[2 of 2 positions shown; findings below may reference images not displayed]

FINDINGS: Surgical clips/clamps previously demonstrated in the region of the
GE junction are not visible today. No mediastinal air is observed.
The lungs are well-expanded. There is stable interstitial density at
the left base. The heart and pulmonary vascularity are normal. The
trachea is midline. The bony thorax is unremarkable.
IMPRESSION: Previously demonstrated metallic clamps in the region of the GE
junction are not visible on today's study.

## 2019-08-30 ENCOUNTER — Ambulatory Visit: Payer: Medicare Other | Attending: Internal Medicine

## 2019-08-30 DIAGNOSIS — Z23 Encounter for immunization: Secondary | ICD-10-CM | POA: Insufficient documentation

## 2019-08-30 NOTE — Progress Notes (Signed)
   Covid-19 Vaccination Clinic  Name:  David Gilmore    MRN: [id number] DOB: 06/10/1951  08/30/2019  David Gilmore was observed post Covid-19 immunization for 15 minutes without incidence. He was provided with Vaccine Information Sheet and instruction to access the V-Safe system.   David Gilmore was instructed to call 911 with any severe reactions post vaccine: Marland Kitchen Difficulty breathing  . Swelling of your face and throat  . A fast heartbeat  . A bad rash all over your body  . Dizziness and weakness    Immunizations Administered    Name Date Dose VIS Date Route   Pfizer COVID-19 Vaccine 08/30/2019  3:10 PM 0.3 mL 06/14/2019 Intramuscular   Manufacturer: [place]   Lot: [id number]   [place]: [id number]

## 2019-09-24 ENCOUNTER — Ambulatory Visit: Payer: Medicare Other | Attending: Internal Medicine

## 2019-09-24 DIAGNOSIS — Z23 Encounter for immunization: Secondary | ICD-10-CM

## 2019-09-24 NOTE — Progress Notes (Signed)
   Covid-19 Vaccination Clinic  Name:  Velmar Kilgus    MRN: [id number] DOB: [birth year]  09/24/2019  Mr. Gerstenberger was observed post Covid-19 immunization for 15 minutes without incident. He was provided with Vaccine Information Sheet and instruction to access the V-Safe system.   Mr. Palko was instructed to call 911 with any severe reactions post vaccine: Marland Kitchen Difficulty breathing  . Swelling of face and throat  . A fast heartbeat  . A bad rash all over body  . Dizziness and weakness   Immunizations Administered    Name Date Dose VIS Date Route   Pfizer COVID-19 Vaccine 09/24/2019  8:58 AM 0.3 mL 06/14/2019 Intramuscular   Manufacturer: [place]   Lot: [id number]   [place]: [id number]

## 2019-10-23 ENCOUNTER — Telehealth: Payer: Self-pay | Admitting: Family Medicine

## 2019-10-23 NOTE — Telephone Encounter (Signed)
Left message for patient to call back and schedule Medicare Annual Wellness Visit (AWV) either virtually/audio only OR in office. Whatever the patients preference is.  Last AWV 05/10/16; please schedule at anytime with [facility]-Nurse Health Advisor at [facility].

## 2019-10-29 ENCOUNTER — Other Ambulatory Visit: Payer: Self-pay | Admitting: Family Medicine

## 2019-11-04 ENCOUNTER — Other Ambulatory Visit: Payer: Self-pay | Admitting: Family Medicine

## 2019-11-07 ENCOUNTER — Other Ambulatory Visit: Payer: Self-pay

## 2019-11-07 MED ORDER — AMLODIPINE BESYLATE 5 MG PO TABS: 5.0000 mg | ORAL_TABLET | Freq: Every day | ORAL | 1 refills | Status: DC

## 2019-11-08 ENCOUNTER — Other Ambulatory Visit: Payer: Self-pay | Admitting: Family Medicine

## 2019-11-20 ENCOUNTER — Other Ambulatory Visit: Payer: Self-pay | Admitting: Family Medicine

## 2019-12-19 ENCOUNTER — Other Ambulatory Visit: Payer: Self-pay

## 2019-12-19 ENCOUNTER — Encounter: Payer: Self-pay | Admitting: Family Medicine

## 2019-12-19 ENCOUNTER — Ambulatory Visit (INDEPENDENT_AMBULATORY_CARE_PROVIDER_SITE_OTHER): Payer: Medicare Other | Admitting: Family Medicine

## 2019-12-19 VITALS — BP 122/80 | HR 67 | Temp 98.8°F | Ht 71.0 in | Wt 182.8 lb

## 2019-12-19 DIAGNOSIS — N401 Enlarged prostate with lower urinary tract symptoms: Secondary | ICD-10-CM | POA: Diagnosis not present

## 2019-12-19 DIAGNOSIS — I700 Atherosclerosis of aorta: Secondary | ICD-10-CM | POA: Diagnosis not present

## 2019-12-19 DIAGNOSIS — R351 Nocturia: Secondary | ICD-10-CM

## 2019-12-19 DIAGNOSIS — I739 Peripheral vascular disease, unspecified: Secondary | ICD-10-CM

## 2019-12-19 DIAGNOSIS — I10 Essential (primary) hypertension: Secondary | ICD-10-CM | POA: Diagnosis not present

## 2019-12-19 DIAGNOSIS — E785 Hyperlipidemia, unspecified: Secondary | ICD-10-CM | POA: Diagnosis not present

## 2019-12-19 DIAGNOSIS — D509 Iron deficiency anemia, unspecified: Secondary | ICD-10-CM | POA: Diagnosis not present

## 2019-12-19 MED ORDER — AMLODIPINE BESYLATE 10 MG PO TABS
10.0000 mg | ORAL_TABLET | Freq: Every day | ORAL | 3 refills | Status: DC
Start: 2019-12-19 — End: 2020-06-09

## 2019-12-19 NOTE — Progress Notes (Signed)
Phone [phone] In person visit   Subjective:   David Gilmore is a 69 y.o. year old very pleasant male patient who presents for/with See problem oriented charting Chief Complaint  Patient presents with  . Follow-up  . Hypertension  . Hyperlipidemia    This visit occurred during the SARS-CoV-2 public health emergency.  Safety protocols were in place, including screening questions prior to the visit, additional usage of staff PPE, and extensive cleaning of exam room while observing appropriate contact time as indicated for disinfecting solutions.   Past Medical History-  Patient Active Problem List   Diagnosis Date Noted  . PAD (peripheral artery disease) ([place]) 09/19/2018    Priority: High  . Mallory-Weiss tear     Priority: High  . History of heart failure     Priority: High  . Anemia 08/23/2017    Priority: High  . BPH associated with nocturia 08/16/2017    Priority: Medium  . Shortness of breath 08/16/2017    Priority: Medium  . Essential hypertension 06/22/2015    Priority: Medium  . Hyperlipidemia     Priority: Medium  . Aortic atherosclerosis ([place]) 08/16/2017    Priority: Low  . Abnormal stress test 07/27/2015    Priority: Low  . Former smoker 06/15/2015    Priority: Low    Medications- reviewed and updated Current Outpatient Medications  Medication Sig Dispense Refill  . aspirin 81 MG tablet Take 81 mg by mouth daily.    . cholecalciferol (VITAMIN D3) 25 MCG (1000 UT) tablet Take 1,000 Units by mouth daily.    . hydrochlorothiazide (HYDRODIURIL) 25 MG tablet Take 1 tablet by mouth once daily 90 tablet 0  . rosuvastatin (CRESTOR) 40 MG tablet Take 1 tablet by mouth once daily 90 tablet 0  . sildenafil (REVATIO) 20 MG tablet Take 2-5 tablets as needed once every 48 hours for erectile dysfunction 50 tablet 3  . amLODipine (NORVASC) 10 MG tablet Take 1 tablet (10 mg total) by mouth daily. 90 tablet 3   No current facility-administered medications for  this visit.     Objective:  BP 122/80   Pulse 67   Temp 98.8 F (37.1 C)   Ht 5\' 11"  (1.803 m)   Wt 182 lb 12.8 oz (82.9 kg)   SpO2 96%   BMI 25.50 kg/m  Gen: NAD, resting comfortably CV: RRR no murmurs rubs or gallops Lungs: CTAB no crackles, wheeze, rhonchi Ext: no edema Skin: warm, dry    Assessment and Plan   #hyperlipidemia #Peripheral arterial disease-noted 07/02/2018-"left ankle-brachial index indicates moderate left lower extremity arterial disease" #aortic atherosclerosis- incidental finding on imaging S: Medication: Crestor 40Mg .  Last LDL at goal under 70  Patient remains asymptomatic without claudication.  He remains on aspirin 81 mg Lab Results  Component Value Date   CHOL 142 09/18/2018   HDL 35.50 (L) 09/18/2018   LDLCALC 86 09/18/2018   LDLDIRECT 66.0 12/20/2018   TRIG 105.0 09/18/2018   CHOLHDL 4 09/18/2018   A/P: Peripheral arterial disease remains without claudication.  We will continue aspirin and statin  Numbers hyperlipidemia-LDL goal would be at least under 70-update lipid panel with labs today.  Likely continue current medications  #hypertension S: medication: HCTZ 25Mg , Amlodipine 5MG , diltiazem 120 mg extended release Home readings #s: does not check but has cuff BP Readings from Last 3 Encounters:  12/19/19 122/80  06/21/19 138/88  12/20/18 132/84  A/P: Excellent control-could continue current medication. With thtat being said did chart review  and Between Dr. Einar Gip and myself we have crafted a somewhat acceptable blood pressure regimen -It sounds like diltiazem is $70 q3 month -We are going to stop diltiazem 120 mg -We are going to increase amlodipine to 10 mg. -I asked him to monitor blood pressure at home to make sure he remains 138/88 or less and if it is above that the let me know.  #low ferritin/iron deficiency- taking iron once a week- will update ferritin today  #BPH with nocturia- nocturia down to about once a night for most  part- occasionally 2. Will check PSA likely for last time if symptoms stable Lab Results  Component Value Date   PSA 2.63 12/20/2018   PSA 2.40 08/16/2017   # weight loss- wife not cooking as much so he feels he is eating less plus not going to [employer]- will monitor.   Recommended follow up: 6 months or sooner if needed   Lab/Order associations:   ICD-10-CM   1. Essential hypertension  I10   2. Hyperlipidemia, unspecified hyperlipidemia type  E78.5 CBC with Differential/Platelet    Comprehensive metabolic panel    Lipid panel  3. Aortic atherosclerosis (HCC)  I70.0   4. PAD (peripheral artery disease) (HCC)  I73.9   5. BPH associated with nocturia  N40.1 PSA   R35.1   6. Iron deficiency anemia, unspecified iron deficiency anemia type  D50.9 IBC + Ferritin   Meds ordered this encounter  Medications  . amLODipine (NORVASC) 10 MG tablet    Sig: Take 1 tablet (10 mg total) by mouth daily.    Dispense:  90 tablet    Refill:  3   Return precautions advised.  [name], MD

## 2019-12-19 NOTE — Patient Instructions (Addendum)
Between Dr. Einar Gip and myself we have crafted a somewhat acceptable blood pressure regimen -It sounds like diltiazem is $70 q3 month -We are going to stop diltiazem 120 mg -We are going to increase amlodipine to 10 mg. -I asked him to monitor blood pressure at home to make sure he remains 138/88 or less and if it is above that the let me know.  Please stop by lab before you go If you have mychart- we will send your results within 3 business days of [place] receiving them.  If you do not have mychart- we will call you about results within 5 business days of [place] receiving them.   Recommended follow up: 6 months or sooner if needed

## 2019-12-20 ENCOUNTER — Ambulatory Visit: Payer: Medicare Other | Admitting: Family Medicine

## 2019-12-20 LAB — CBC WITH DIFFERENTIAL/PLATELET
Basophils Absolute: 0 10*3/uL (ref 0.0–0.1)
Basophils Relative: 0.4 % (ref 0.0–3.0)
Eosinophils Absolute: 0.1 10*3/uL (ref 0.0–0.7)
Eosinophils Relative: 2.5 % (ref 0.0–5.0)
HCT: 43.5 % (ref 39.0–52.0)
Hemoglobin: 14.5 g/dL (ref 13.0–17.0)
Lymphocytes Relative: 32 % (ref 12.0–46.0)
Lymphs Abs: 1.5 10*3/uL (ref 0.7–4.0)
MCHC: 33.3 g/dL (ref 30.0–36.0)
MCV: 80.7 fl (ref 78.0–100.0)
Monocytes Absolute: 0.5 10*3/uL (ref 0.1–1.0)
Monocytes Relative: 10.9 % (ref 3.0–12.0)
Neutro Abs: 2.5 10*3/uL (ref 1.4–7.7)
Neutrophils Relative %: 54.2 % (ref 43.0–77.0)
Platelets: 253 10*3/uL (ref 150.0–400.0)
RBC: 5.39 Mil/uL (ref 4.22–5.81)
RDW: 14.4 % (ref 11.5–15.5)
WBC: 4.6 10*3/uL (ref 4.0–10.5)

## 2019-12-20 LAB — LIPID PANEL
Cholesterol: 117 mg/dL (ref 0–200)
HDL: 30.8 mg/dL — ABNORMAL LOW (ref >39.00)
LDL Cholesterol: 64 mg/dL (ref 0–99)
NonHDL: 85.73
Total CHOL/HDL Ratio: 4
Triglycerides: 110 mg/dL (ref 0.0–149.0)
VLDL: 22 mg/dL (ref 0.0–40.0)

## 2019-12-20 LAB — IBC + FERRITIN
Ferritin: 18.4 ng/mL — ABNORMAL LOW (ref 22.0–322.0)
Iron: 86 ug/dL (ref 42–165)
Saturation Ratios: 17.2 % — ABNORMAL LOW (ref 20.0–50.0)
Transferrin: 358 mg/dL (ref 212.0–360.0)

## 2019-12-20 LAB — COMPREHENSIVE METABOLIC PANEL
ALT: 26 U/L (ref 0–53)
AST: 20 U/L (ref 0–37)
Albumin: 4.5 g/dL (ref 3.5–5.2)
Alkaline Phosphatase: 63 U/L (ref 39–117)
BUN: 14 mg/dL (ref 6–23)
CO2: 30 mEq/L (ref 19–32)
Calcium: 10.5 mg/dL (ref 8.4–10.5)
Chloride: 100 mEq/L (ref 96–112)
Creatinine, Ser: 1.1 mg/dL (ref 0.40–1.50)
GFR: 80.04 mL/min (ref >60.00)
Glucose, Bld: 83 mg/dL (ref 70–99)
Potassium: 3.6 mEq/L (ref 3.5–5.1)
Sodium: 135 mEq/L (ref 135–145)
Total Bilirubin: 0.6 mg/dL (ref 0.2–1.2)
Total Protein: 7.5 g/dL (ref 6.0–8.3)

## 2019-12-20 LAB — PSA: PSA: 2.43 ng/mL (ref 0.10–4.00)

## 2020-02-04 ENCOUNTER — Other Ambulatory Visit: Payer: Self-pay | Admitting: Family Medicine

## 2020-03-05 ENCOUNTER — Other Ambulatory Visit: Payer: Self-pay | Admitting: Family Medicine

## 2020-04-06 ENCOUNTER — Other Ambulatory Visit: Payer: Self-pay | Admitting: Family Medicine

## 2020-05-04 DIAGNOSIS — Z23 Encounter for immunization: Secondary | ICD-10-CM | POA: Diagnosis not present

## 2020-05-07 IMAGING — DX PORTABLE CHEST - 1 VIEW
1 series · 1 of 1 positions shown · non-contrast
Comparison: 12/19/2017

CLINICAL DATA: Left-sided chest pain

EXAM:
PORTABLE CHEST 1 VIEW

[chest]
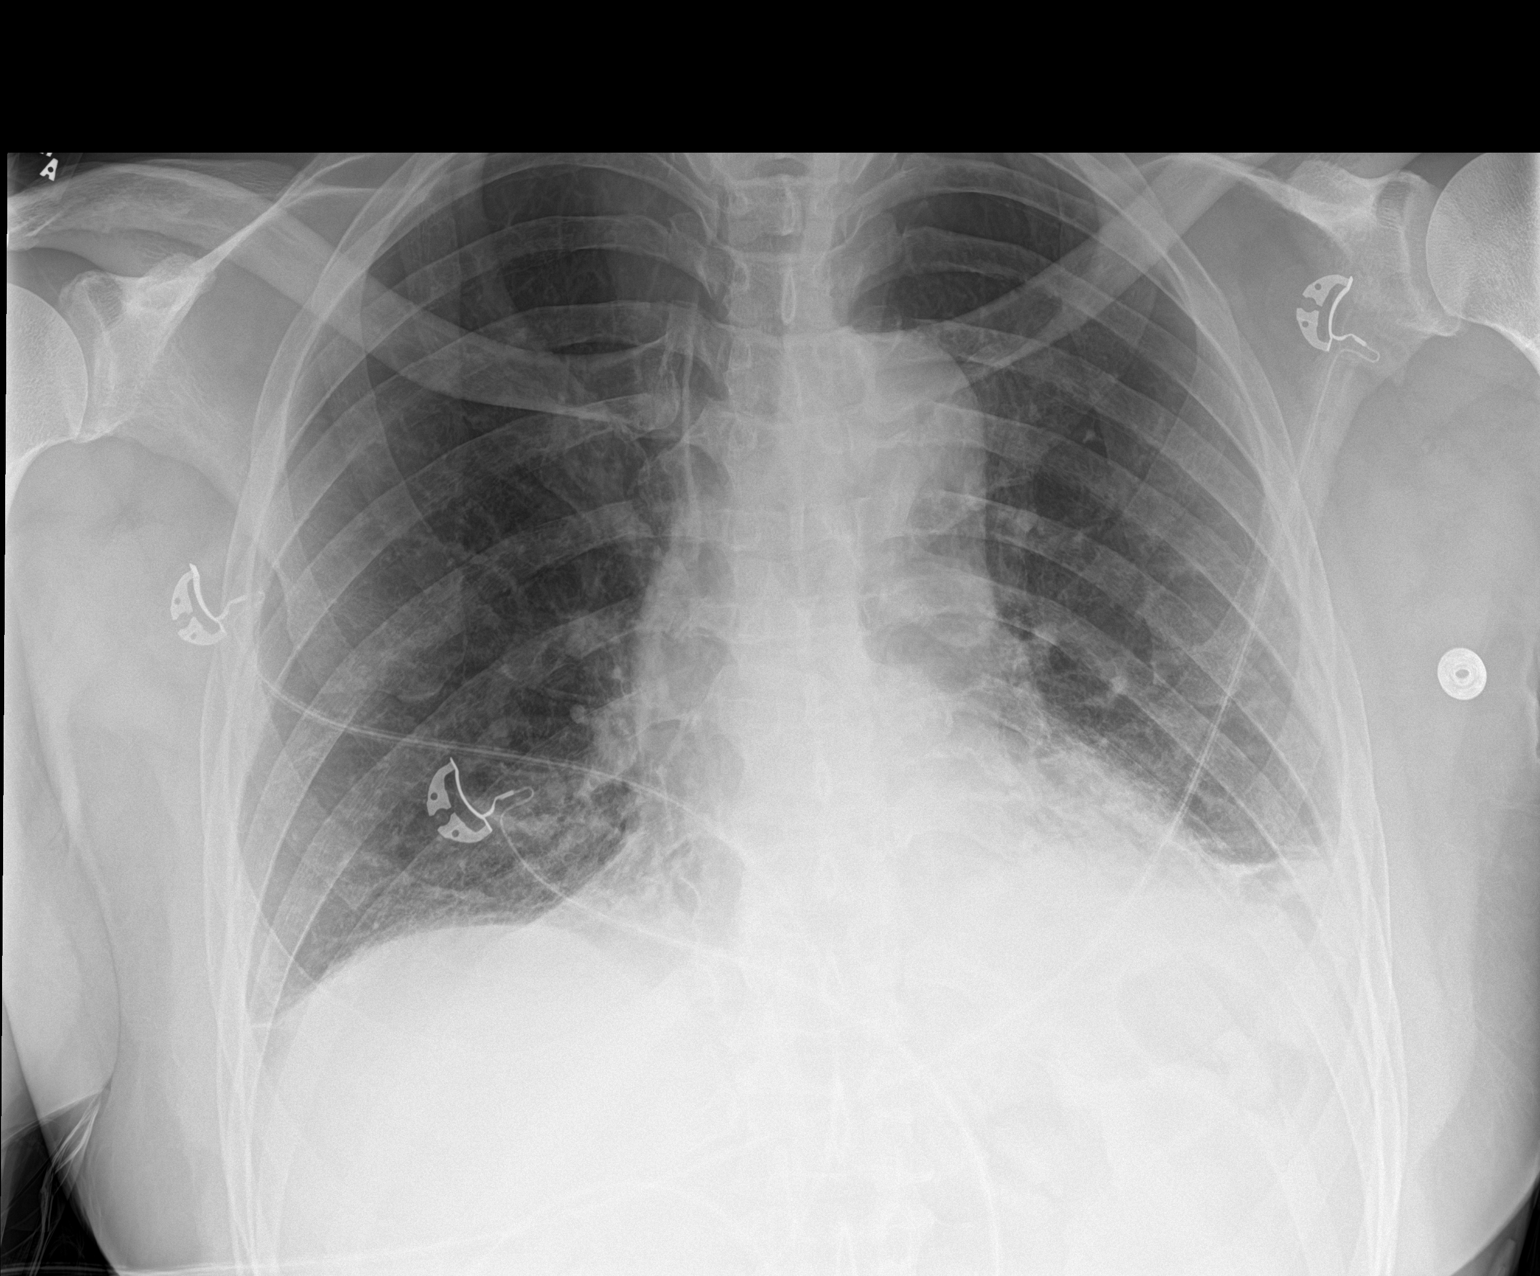

[1 of 1 positions shown; findings below may reference images not displayed]

FINDINGS: Cardiac shadow is stable. Tortuous aorta is again noted. The lungs
are well aerated. Some increase in left basilar density is noted
consistent with some acute on chronic infiltrate. Chronic scarring
is noted in the right base. No sizable effusion is seen. No bony
abnormality is noted. No pneumothorax is noted.
IMPRESSION: New acute on chronic left basilar infiltrate.

## 2020-05-30 ENCOUNTER — Other Ambulatory Visit: Payer: Self-pay | Admitting: Family Medicine

## 2020-06-03 ENCOUNTER — Other Ambulatory Visit: Payer: Self-pay | Admitting: Family Medicine

## 2020-06-09 ENCOUNTER — Telehealth: Payer: Self-pay

## 2020-06-09 MED ORDER — AMLODIPINE BESYLATE 10 MG PO TABS
10.0000 mg | ORAL_TABLET | Freq: Every day | ORAL | 3 refills | Status: DC
Start: 2020-06-09 — End: 2020-10-05

## 2020-06-09 NOTE — Telephone Encounter (Signed)
Rx sent to pharmacy   

## 2020-06-09 NOTE — Telephone Encounter (Signed)
..   LAST APPOINTMENT DATE: 06/03/2020   NEXT APPOINTMENT DATE:@12 /20/2021  MEDICATION:amLODipine (NORVASC) 10 MG tablet

## 2020-06-12 ENCOUNTER — Telehealth: Payer: Self-pay

## 2020-06-12 NOTE — Telephone Encounter (Signed)
Pharmacy changed

## 2020-06-12 NOTE — Telephone Encounter (Signed)
Pt called requesting to change pharmacy from [place] at [place] to [place] on [address] with [facility]

## 2020-06-18 NOTE — Progress Notes (Signed)
Phone [phone] In person visit   Subjective:   David Gilmore is a 69 y.o. year old very pleasant male patient who presents for/with See problem oriented charting Chief Complaint  Patient presents with  . Hypertension  . Hyperlipidemia   This visit occurred during the SARS-CoV-2 public health emergency.  Safety protocols were in place, including screening questions prior to the visit, additional usage of staff PPE, and extensive cleaning of exam room while observing appropriate contact time as indicated for disinfecting solutions.   Past Medical History-  Patient Active Problem List   Diagnosis Date Noted  . PAD (peripheral artery disease) ([place]) 09/19/2018    Priority: High  . Mallory-Weiss tear     Priority: High  . History of heart failure     Priority: High  . Anemia 08/23/2017    Priority: High  . BPH associated with nocturia 08/16/2017    Priority: Medium  . Shortness of breath 08/16/2017    Priority: Medium  . Essential hypertension 06/22/2015    Priority: Medium  . Hyperlipidemia     Priority: Medium  . Aortic atherosclerosis ([place]) 08/16/2017    Priority: Low  . Abnormal stress test 07/27/2015    Priority: Low  . Former smoker 06/15/2015    Priority: Low    Medications- reviewed and updated Current Outpatient Medications  Medication Sig Dispense Refill  . amLODipine (NORVASC) 10 MG tablet Take 1 tablet (10 mg total) by mouth daily. 90 tablet 3  . aspirin 81 MG tablet Take 81 mg by mouth daily.    . cholecalciferol (VITAMIN D3) 25 MCG (1000 UT) tablet Take 1,000 Units by mouth daily.    . hydrochlorothiazide (HYDRODIURIL) 25 MG tablet Take 1 tablet by mouth once daily 90 tablet 0  . rosuvastatin (CRESTOR) 40 MG tablet Take 1 tablet by mouth once daily 90 tablet 0  . sildenafil (REVATIO) 20 MG tablet TAKE 2 TO 5 TABLETS BY MOUTH EVERY 48 HOURS AS NEEDED FOR ERECTILE DYSFUNCTION 50 tablet 0   No current facility-administered medications for this visit.      Objective:  BP 133/72   Pulse 95   Temp (!) 97.5 F (36.4 C) (Temporal)   Ht 5\' 11"  (1.803 m)   Wt 193 lb 6.4 oz (87.7 kg)   SpO2 98%   BMI 26.97 kg/m  Gen: NAD, resting comfortably CV: RRR no murmurs rubs or gallops Lungs: CTAB no crackles, wheeze, rhonchi Ext: trace edema on right foot, 1+ on left foot- none at ankle Skin: warm, dry    Assessment and Plan   #hyperlipidemia #aortic atherosclerosis- incidental finding but LDL goal under 70 #Peripheral arterial disease with LDL goal under 70.  Noted on arterial study 07/02/2018-"Left: Resting left ankle-brachial index indicates moderate left lower extremity arterial disease. The left toe-brachial index is abnormal. CW waveforms suggest tibial disease." S: Medication: Rosuvastatin 40MG , aspirin 81 mg Denies claudication  Lab Results  Component Value Date   CHOL 117 12/19/2019   HDL 30.80 (L) 12/19/2019   LDLCALC 64 12/19/2019   LDLDIRECT 66.0 12/20/2018   TRIG 110.0 12/19/2019   CHOLHDL 4 12/19/2019   A/P: all issues stable- continue current meds  #hypertension # history of heart failure= EF normalized with better blood pressure control S: medication: amlodipine 10Mg , HCTZ 25Mg  Home readings #s: does not check at home BP Readings from Last 3 Encounters:  06/22/20 133/72  12/19/19 122/80  06/21/19 138/88  A/P: Stable. Continue current medications.   #feet swelling for about  a month- will update labs . Suspect related to amlodipine   # low ferritin- ferritin has been low normal- twice a week iron recommended  Recommended follow up: Return in about 6 months (around 12/21/2020) for follow up- or sooner if needed.  Lab/Order associations:   ICD-10-CM   1. Essential hypertension  I10 CBC With Differential/Platelet    COMPLETE METABOLIC PANEL WITH GFR  2. Hyperlipidemia, unspecified hyperlipidemia type  E78.5 CBC With Differential/Platelet    COMPLETE METABOLIC PANEL WITH GFR    TSH  3. PAD (peripheral artery  disease) (HCC)  I73.9   4. Edema, unspecified type  R60.9 TSH  5. Low ferritin  R79.0 Iron, TIBC and Ferritin Panel    No orders of the defined types were placed in this encounter.    Return precautions advised.  [name], MD

## 2020-06-18 NOTE — Patient Instructions (Addendum)
Health Maintenance Due  Topic Date Due  . TETANUS/TDAP - would recommend considering this at pharmacy Never done  . INFLUENZA VACCINE - today Never done  . COVID-19 Vaccine (3 - Booster for [employer] series)- please send [place] the date of the booster 03/26/2020   Please schedule annual wellness visit with our nurse specialist Otila Kluver before you leave  Please stop by lab before you go If you have mychart- we will send your results within 3 business days of [place] receiving them.  If you do not have mychart- we will call you about results within 5 business days of [place] receiving them.  *please note we are currently using [facility] which has a longer processing time than [place] typically so labs may not come back as quickly as in the past *please also note that you will see labs on mychart as soon as they post. I will later go in and write notes on them- will say "notes from Dr. Yong Channel"

## 2020-06-22 ENCOUNTER — Telehealth: Payer: Self-pay

## 2020-06-22 ENCOUNTER — Other Ambulatory Visit: Payer: Self-pay

## 2020-06-22 ENCOUNTER — Ambulatory Visit (INDEPENDENT_AMBULATORY_CARE_PROVIDER_SITE_OTHER): Payer: Medicare Other | Admitting: Family Medicine

## 2020-06-22 ENCOUNTER — Encounter: Payer: Self-pay | Admitting: Family Medicine

## 2020-06-22 VITALS — BP 133/72 | HR 95 | Temp 97.5°F | Ht 71.0 in | Wt 193.4 lb

## 2020-06-22 DIAGNOSIS — E785 Hyperlipidemia, unspecified: Secondary | ICD-10-CM | POA: Diagnosis not present

## 2020-06-22 DIAGNOSIS — R790 Abnormal level of blood mineral: Secondary | ICD-10-CM

## 2020-06-22 DIAGNOSIS — I10 Essential (primary) hypertension: Secondary | ICD-10-CM | POA: Diagnosis not present

## 2020-06-22 DIAGNOSIS — Z23 Encounter for immunization: Secondary | ICD-10-CM | POA: Diagnosis not present

## 2020-06-22 DIAGNOSIS — I739 Peripheral vascular disease, unspecified: Secondary | ICD-10-CM | POA: Diagnosis not present

## 2020-06-22 DIAGNOSIS — R609 Edema, unspecified: Secondary | ICD-10-CM

## 2020-06-22 NOTE — Telephone Encounter (Signed)
Chart updated

## 2020-06-22 NOTE — Telephone Encounter (Signed)
Patient is updating chart : received pfizer booster on 05/04/20

## 2020-06-23 ENCOUNTER — Other Ambulatory Visit: Payer: Self-pay

## 2020-06-23 ENCOUNTER — Telehealth: Payer: Self-pay

## 2020-06-23 LAB — COMPLETE METABOLIC PANEL WITH GFR
AG Ratio: 1.5 (calc) (ref 1.0–2.5)
ALT: 31 U/L (ref 9–46)
AST: 27 U/L (ref 10–35)
Albumin: 4.3 g/dL (ref 3.6–5.1)
Alkaline phosphatase (APISO): 60 U/L (ref 35–144)
BUN: 12 mg/dL (ref 7–25)
CO2: 31 mmol/L (ref 20–32)
Calcium: 10.6 mg/dL — ABNORMAL HIGH (ref 8.6–10.3)
Chloride: 104 mmol/L (ref 98–110)
Creat: 1.04 mg/dL (ref 0.70–1.18)
GFR, Est African American: 84 mL/min/{1.73_m2} (ref >=60)
GFR, Est Non African American: 72 mL/min/{1.73_m2} (ref >=60)
Globulin: 2.8 g/dL (calc) (ref 1.9–3.7)
Glucose, Bld: 80 mg/dL (ref 65–99)
Potassium: 3.7 mmol/L (ref 3.5–5.3)
Sodium: 141 mmol/L (ref 135–146)
Total Bilirubin: 0.5 mg/dL (ref 0.2–1.2)
Total Protein: 7.1 g/dL (ref 6.1–8.1)

## 2020-06-23 LAB — IRON,TIBC AND FERRITIN PANEL
%SAT: 21 % (calc) (ref 20–48)
Ferritin: 27 ng/mL (ref 24–380)
Iron: 84 ug/dL (ref 50–180)
TIBC: 404 mcg/dL (calc) (ref 250–425)

## 2020-06-23 LAB — CBC WITH DIFFERENTIAL/PLATELET
Absolute Monocytes: 484 cells/uL (ref 200–950)
Basophils Absolute: 40 cells/uL (ref 0–200)
Basophils Relative: 1 %
Eosinophils Absolute: 140 cells/uL (ref 15–500)
Eosinophils Relative: 3.5 %
HCT: 44.3 % (ref 38.5–50.0)
Hemoglobin: 14.8 g/dL (ref 13.2–17.1)
Lymphs Abs: 1452 cells/uL (ref 850–3900)
MCH: 27.5 pg (ref 27.0–33.0)
MCHC: 33.4 g/dL (ref 32.0–36.0)
MCV: 82.2 fL (ref 80.0–100.0)
MPV: 10.2 fL (ref 7.5–12.5)
Monocytes Relative: 12.1 %
Neutro Abs: 1884 cells/uL (ref 1500–7800)
Neutrophils Relative %: 47.1 %
Platelets: 229 10*3/uL (ref 140–400)
RBC: 5.39 10*6/uL (ref 4.20–5.80)
RDW: 14 % (ref 11.0–15.0)
Total Lymphocyte: 36.3 %
WBC: 4 10*3/uL (ref 3.8–10.8)

## 2020-06-23 LAB — TSH: TSH: 1.15 mIU/L (ref 0.40–4.50)

## 2020-06-23 MED ORDER — ROSUVASTATIN CALCIUM 40 MG PO TABS: 40.0000 mg | ORAL_TABLET | Freq: Every day | ORAL | 3 refills | Status: DC

## 2020-06-23 NOTE — Telephone Encounter (Signed)
Next appointment is 12/29/2020 , his medication has been refilled and sent to the pharmacy.

## 2020-06-23 NOTE — Telephone Encounter (Signed)
..   LAST APPOINTMENT DATE: 06/22/2020   NEXT APPOINTMENT DATE:@Visit  date not found  MEDICATION:rosuvastatin (CRESTOR) 40 MG tablet    PHARMACY: [facility] [place], [address] [place]

## 2020-06-24 ENCOUNTER — Other Ambulatory Visit: Payer: Self-pay

## 2020-06-24 DIAGNOSIS — E0590 Thyrotoxicosis, unspecified without thyrotoxic crisis or storm: Secondary | ICD-10-CM

## 2020-07-08 ENCOUNTER — Other Ambulatory Visit: Payer: Self-pay

## 2020-07-08 ENCOUNTER — Other Ambulatory Visit (INDEPENDENT_AMBULATORY_CARE_PROVIDER_SITE_OTHER): Payer: Medicare Other

## 2020-07-08 DIAGNOSIS — E0590 Thyrotoxicosis, unspecified without thyrotoxic crisis or storm: Secondary | ICD-10-CM | POA: Diagnosis not present

## 2020-07-08 NOTE — Addendum Note (Signed)
Addended by: Alvino Lechuga F on: 07/08/2020 09:59 AM   Modules accepted: Orders  

## 2020-07-08 NOTE — Addendum Note (Signed)
Addended by: Cleda Mccreedy F on: 07/08/2020 09:59 AM   Modules accepted: Orders

## 2020-07-09 LAB — PARATHYROID HORMONE, INTACT (NO CA): PTH: 50 pg/mL (ref 14–64)

## 2020-07-09 LAB — CALCIUM, IONIZED: Calcium, Ion: 5.4 mg/dL (ref 4.8–5.6)

## 2020-09-11 ENCOUNTER — Telehealth: Payer: Self-pay | Admitting: Family Medicine

## 2020-09-11 NOTE — Telephone Encounter (Signed)
Left message for patient to call back and schedule Medicare Annual Wellness Visit (AWV) either virtually OR in office.   Last AWV 05/10/16; please schedule at anytime with [facility]-Nurse Health Advisor at [facility].  This should be a 45 minute visit.

## 2020-10-05 ENCOUNTER — Telehealth: Payer: Self-pay

## 2020-10-05 MED ORDER — AMLODIPINE BESYLATE 10 MG PO TABS: 10.0000 mg | ORAL_TABLET | Freq: Every day | ORAL | 3 refills | Status: DC

## 2020-10-05 MED ORDER — HYDROCHLOROTHIAZIDE 25 MG PO TABS: 1.0000 | ORAL_TABLET | Freq: Every day | ORAL | 2 refills | Status: DC

## 2020-10-05 NOTE — Telephone Encounter (Signed)
.   LAST APPOINTMENT DATE: 09/11/2020   NEXT APPOINTMENT DATE:@6 /28/2022  MEDICATION:hydrochlorothiazide (HYDRODIURIL) 25 MG tablet amLODipine (NORVASC) 10 MG tablet     PHARMACY:[id number] - [place], [place] - [address] AT [address]

## 2020-10-05 NOTE — Telephone Encounter (Signed)
Rx refilled.

## 2020-11-26 ENCOUNTER — Other Ambulatory Visit (HOSPITAL_BASED_OUTPATIENT_CLINIC_OR_DEPARTMENT_OTHER): Payer: Self-pay

## 2020-11-26 ENCOUNTER — Ambulatory Visit: Payer: Medicare Other | Attending: Internal Medicine

## 2020-11-26 ENCOUNTER — Other Ambulatory Visit: Payer: Self-pay

## 2020-11-26 DIAGNOSIS — Z23 Encounter for immunization: Secondary | ICD-10-CM

## 2020-11-26 MED ORDER — PFIZER-BIONT COVID-19 VAC-TRIS 30 MCG/0.3ML IM SUSP
INTRAMUSCULAR | 0 refills | Status: DC
  Filled 2020-11-26: qty 0.3, 1d supply, fill #0

## 2020-11-26 NOTE — Progress Notes (Signed)
   Covid-19 Vaccination Clinic  Name:  David Gilmore    MRN: [id number] DOB: [birth year]  11/26/2020  Mr. Fishbaugh was observed post Covid-19 immunization for 15 minutes without incident. He was provided with Vaccine Information Sheet and instruction to access the V-Safe system.   Mr. Dinino was instructed to call 911 with any severe reactions post vaccine: Marland Kitchen Difficulty breathing  . Swelling of face and throat  . A fast heartbeat  . A bad rash all over body  . Dizziness and weakness   Immunizations Administered    Name Date Dose VIS Date Route   PFIZER Comrnaty(Gray TOP) Covid-19 Vaccine 11/26/2020 12:39 PM 0.3 mL 06/11/2020 Intramuscular   Manufacturer: [employer]   Lot: [id number]   NDC: [phone]

## 2020-12-29 ENCOUNTER — Ambulatory Visit (INDEPENDENT_AMBULATORY_CARE_PROVIDER_SITE_OTHER): Payer: Medicare Other | Admitting: Family Medicine

## 2020-12-29 ENCOUNTER — Encounter: Payer: Self-pay | Admitting: Family Medicine

## 2020-12-29 ENCOUNTER — Other Ambulatory Visit: Payer: Self-pay

## 2020-12-29 VITALS — BP 134/88 | HR 94 | Temp 98.7°F | Ht 71.0 in | Wt 194.6 lb

## 2020-12-29 DIAGNOSIS — I700 Atherosclerosis of aorta: Secondary | ICD-10-CM | POA: Diagnosis not present

## 2020-12-29 DIAGNOSIS — Z86010 Personal history of colonic polyps: Secondary | ICD-10-CM | POA: Diagnosis not present

## 2020-12-29 DIAGNOSIS — I739 Peripheral vascular disease, unspecified: Secondary | ICD-10-CM | POA: Diagnosis not present

## 2020-12-29 DIAGNOSIS — E785 Hyperlipidemia, unspecified: Secondary | ICD-10-CM

## 2020-12-29 DIAGNOSIS — I10 Essential (primary) hypertension: Secondary | ICD-10-CM

## 2020-12-29 LAB — LIPID PANEL
Cholesterol: 125 mg/dL (ref 0–200)
HDL: 35.7 mg/dL — ABNORMAL LOW (ref >39.00)
LDL Cholesterol: 68 mg/dL (ref 0–99)
NonHDL: 89.69
Total CHOL/HDL Ratio: 4
Triglycerides: 106 mg/dL (ref 0.0–149.0)
VLDL: 21.2 mg/dL (ref 0.0–40.0)

## 2020-12-29 LAB — COMPREHENSIVE METABOLIC PANEL
ALT: 42 U/L (ref 0–53)
AST: 35 U/L (ref 0–37)
Albumin: 4.4 g/dL (ref 3.5–5.2)
Alkaline Phosphatase: 59 U/L (ref 39–117)
BUN: 12 mg/dL (ref 6–23)
CO2: 34 mEq/L — ABNORMAL HIGH (ref 19–32)
Calcium: 10.7 mg/dL — ABNORMAL HIGH (ref 8.4–10.5)
Chloride: 98 mEq/L (ref 96–112)
Creatinine, Ser: 1.1 mg/dL (ref 0.40–1.50)
GFR: 67.69 mL/min (ref >60.00)
Glucose, Bld: 78 mg/dL (ref 70–99)
Potassium: 3.8 mEq/L (ref 3.5–5.1)
Sodium: 138 mEq/L (ref 135–145)
Total Bilirubin: 0.6 mg/dL (ref 0.2–1.2)
Total Protein: 8 g/dL (ref 6.0–8.3)

## 2020-12-29 LAB — CBC WITH DIFFERENTIAL/PLATELET
Basophils Absolute: 0 10*3/uL (ref 0.0–0.1)
Basophils Relative: 1 % (ref 0.0–3.0)
Eosinophils Absolute: 0.1 10*3/uL (ref 0.0–0.7)
Eosinophils Relative: 2 % (ref 0.0–5.0)
HCT: 44.5 % (ref 39.0–52.0)
Hemoglobin: 15.1 g/dL (ref 13.0–17.0)
Lymphocytes Relative: 38.6 % (ref 12.0–46.0)
Lymphs Abs: 1.6 10*3/uL (ref 0.7–4.0)
MCHC: 33.8 g/dL (ref 30.0–36.0)
MCV: 80.8 fl (ref 78.0–100.0)
Monocytes Absolute: 0.5 10*3/uL (ref 0.1–1.0)
Monocytes Relative: 11.3 % (ref 3.0–12.0)
Neutro Abs: 2 10*3/uL (ref 1.4–7.7)
Neutrophils Relative %: 47.1 % (ref 43.0–77.0)
Platelets: 207 10*3/uL (ref 150.0–400.0)
RBC: 5.51 Mil/uL (ref 4.22–5.81)
RDW: 14.1 % (ref 11.5–15.5)
WBC: 4.2 10*3/uL (ref 4.0–10.5)

## 2020-12-29 NOTE — Progress Notes (Signed)
Phone [phone] In person visit   Subjective:   David Gilmore is a 70 y.o. year old very pleasant male patient who presents for/with See problem oriented charting Chief Complaint  Patient presents with   Hypertension   Hyperlipidemia   This visit occurred during the SARS-CoV-2 public health emergency.  Safety protocols were in place, including screening questions prior to the visit, additional usage of staff PPE, and extensive cleaning of exam room while observing appropriate contact time as indicated for disinfecting solutions.   Past Medical History-  Patient Active Problem List   Diagnosis Date Noted   PAD (peripheral artery disease) ([place]) 09/19/2018    Priority: High   Mallory-Weiss tear     Priority: High   History of heart failure     Priority: High   Anemia 08/23/2017    Priority: High   BPH associated with nocturia 08/16/2017    Priority: Medium   Shortness of breath 08/16/2017    Priority: Medium   Essential hypertension 06/22/2015    Priority: Medium   Hyperlipidemia     Priority: Medium   Aortic atherosclerosis ([place]) 08/16/2017    Priority: Low   Abnormal stress test 07/27/2015    Priority: Low   Former smoker 06/15/2015    Priority: Low    Medications- reviewed and updated Current Outpatient Medications  Medication Sig Dispense Refill   amLODipine (NORVASC) 10 MG tablet Take 1 tablet (10 mg total) by mouth daily. 90 tablet 3   aspirin 81 MG tablet Take 81 mg by mouth daily.     cholecalciferol (VITAMIN D3) 25 MCG (1000 UT) tablet Take 1,000 Units by mouth daily.     hydrochlorothiazide (HYDRODIURIL) 25 MG tablet Take 1 tablet (25 mg total) by mouth daily. 90 tablet 2   rosuvastatin (CRESTOR) 40 MG tablet Take 1 tablet (40 mg total) by mouth daily. 90 tablet 3   sildenafil (REVATIO) 20 MG tablet TAKE 2 TO 5 TABLETS BY MOUTH EVERY 48 HOURS AS NEEDED FOR ERECTILE DYSFUNCTION 50 tablet 0   COVID-19 mRNA Vac-TriS, Pfizer, (PFIZER-BIONT COVID-19  VAC-TRIS) SUSP injection Inject into the muscle. 0.3 mL 0   No current facility-administered medications for this visit.     Objective:  BP 134/88   Pulse 94   Temp 98.7 F (37.1 C) (Temporal)   Ht 5\' 11"  (1.803 m)   Wt 194 lb 9.6 oz (88.3 kg)   SpO2 97%   BMI 27.14 kg/m  Gen: NAD, resting comfortably CV: RRR no murmurs rubs or gallops Lungs: CTAB no crackles, wheeze, rhonchi Abdomen: soft/nontender/nondistended/normal bowel sounds.  Ext: no edema Skin: warm, dry     Assessment and Plan  #hyperlipidemia #aortic atherosclerosis- incidental finding but LDL goal under 70 #Peripheral arterial disease with LDL goal under 70. Noted on arterial study 07/02/2018-"Left: Resting left ankle-brachial index indicates moderate left lower extremity arterial disease. The left toe-brachial index is abnormal. CW waveforms suggest tibial disease." S: Medication: Rosuvastatin 40MG , aspirin 81 mg Denies claudication  Lab Results  Component Value Date   CHOL 117 12/19/2019   HDL 30.80 (L) 12/19/2019   LDLCALC 64 12/19/2019   LDLDIRECT 66.0 12/20/2018   TRIG 110.0 12/19/2019   CHOLHDL 4 12/19/2019   A/P: Or aortic atherosclerosis-LDL goal is 70 or less-this would also be to goal for PAD.  He is asymptomatic in regards to PAD-no claudication. For hyperlipidemia-update lipid panel today  #hypertension # history of heart failure- EF normalized with better blood pressure control-has done well despite  amlodipine S: medication: amlodipine 10Mg , HCTZ 25Mg  No shortness of breath, no increased swelling (stable at baseline). No recent weight gain Home readings #s: doesn't check  BP Readings from Last 3 Encounters:  12/29/20 134/88  06/22/20 133/72  12/19/19 122/80  A/P: hypertension stable- continue current rx. Heart failure- no evidence of recurrence  # ron deficiencyanemia- ferritin has been low normal- twice a week iron recommended- he has continued this. Just check CBC for now- ferritin next  visit. Also needs updated colonoscopy- referred back Lab Results  Component Value Date   WBC 4.0 06/22/2020   HGB 14.8 06/22/2020   HCT 44.3 06/22/2020   MCV 82.2 06/22/2020   PLT 229 06/22/2020   Lab Results  Component Value Date   FERRITIN 27 06/22/2020   #HM- declines shingrix and Tdap for now. Declines prevnar 20 for now  Recommended follow up: Return in about 6 months (around 06/30/2021) for follow-up.  Lab/Order associations:   ICD-10-CM   1. Aortic atherosclerosis (HCC)  I70.0     2. Hyperlipidemia, unspecified hyperlipidemia type  E78.5 CBC with Differential/Platelet    Comprehensive metabolic panel    Lipid panel    3. Essential hypertension  I10     4. PAD (peripheral artery disease) (HCC)  I73.9     5. History of adenomatous polyp of colon  Z86.010 Ambulatory referral to Gastroenterology      No orders of the defined types were placed in this encounter.  I,Harris Phan,acting as a Education administrator for [name], MD.,have documented all relevant documentation on the behalf of [name], MD,as directed by  [name], MD while in the presence of [name], MD.  I, [name], MD, have reviewed all documentation for this visit. The documentation on 12/29/20 for the exam, diagnosis, procedures, and orders are all accurate and complete.  Return precautions advised.  [name], MD

## 2020-12-29 NOTE — Patient Instructions (Addendum)
Health Maintenance Due  Topic Date Due   TETANUS/TDAP Discuss.  Deferred today. Never done   Zoster Vaccines- Shingrix (1 of 2) Please check with your pharmacy to see if they have the shingrix vaccine. If they do- please get this immunization and update [place] by phone call or mychart with dates you receive the vaccine  Deferred today. Never done   COLONOSCOPY (Pts 45-63yrs Insurance coverage will need to be confirmed)  Discuss.  11/24/2020   We will call you within two weeks about your referral to GI. If you do not hear within 2 weeks, give [place] a call.   Please stop by lab before you go If you have mychart- we will send your results within 3 business days of [place] receiving them.  If you do not have mychart- we will call you about results within 5 business days of [place] receiving them.  *please also note that you will see labs on mychart as soon as they post. I will later go in and write notes on them- will say "notes from Dr. Yong Channel"   Recommended follow up: No follow-ups on file.

## 2021-01-15 DIAGNOSIS — Z20822 Contact with and (suspected) exposure to covid-19: Secondary | ICD-10-CM | POA: Diagnosis not present

## 2021-02-28 ENCOUNTER — Encounter: Payer: Self-pay | Admitting: Gastroenterology

## 2021-03-22 ENCOUNTER — Ambulatory Visit (INDEPENDENT_AMBULATORY_CARE_PROVIDER_SITE_OTHER): Payer: Medicare Other

## 2021-03-22 DIAGNOSIS — Z0000 Encounter for general adult medical examination without abnormal findings: Secondary | ICD-10-CM | POA: Diagnosis not present
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
## 2021-03-22 NOTE — Patient Instructions (Signed)
David Gilmore , Thank you for taking time to come for your Medicare Wellness Visit. I appreciate your ongoing commitment to your health goals. Please review the following plan we discussed and let me know if I can assist you in the future.   Screening recommendations/referrals: Colonoscopy: order is open and pending  Recommended yearly ophthalmology/optometry visit for glaucoma screening and checkup Recommended yearly dental visit for hygiene and checkup  Vaccinations: Influenza vaccine: Due Pneumococcal vaccine: Completed  Tdap vaccine: Due and discussed  Shingles vaccine: Shingrix discussed. Please contact your pharmacy for coverage information.    Covid-19: Completed 2/26, 3/23, 05/04/20 11/26/20  Advanced directives: Please bring a copy of your health care power of attorney and living will to the office at your convenience.  Conditions/risks identified: Continue to stay healthy  Next appointment: Follow up in one year for your annual wellness visit.   Preventive Care 28 Years and Older, Male Preventive care refers to lifestyle choices and visits with your health care provider that can promote health and wellness. What does preventive care include? A yearly physical exam. This is also called an annual well check. Dental exams once or twice a year. Routine eye exams. Ask your health care provider how often you should have your eyes checked. Personal lifestyle choices, including: Daily care of your teeth and gums. Regular physical activity. Eating a healthy diet. Avoiding tobacco and drug use. Limiting alcohol use. Practicing safe sex. Taking low doses of aspirin every day. Taking vitamin and mineral supplements as recommended by your health care provider. What happens during an annual well check? The services and screenings done by your health care provider during your annual well check will depend on your age, overall health, lifestyle risk factors, and family history of  disease. Counseling  Your health care provider may ask you questions about your: Alcohol use. Tobacco use. Drug use. Emotional well-being. Home and relationship well-being. Sexual activity. Eating habits. History of falls. Memory and ability to understand (cognition). Work and work Statistician. Screening  You may have the following tests or measurements: Height, weight, and BMI. Blood pressure. Lipid and cholesterol levels. These may be checked every 5 years, or more frequently if you are over 37 years old. Skin check. Lung cancer screening. You may have this screening every year starting at age 42 if you have a 30-pack-year history of smoking and currently smoke or have quit within the past 15 years. Fecal occult blood test (FOBT) of the stool. You may have this test every year starting at age 62. Flexible sigmoidoscopy or colonoscopy. You may have a sigmoidoscopy every 5 years or a colonoscopy every 10 years starting at age 1. Prostate cancer screening. Recommendations will vary depending on your family history and other risks. Hepatitis C blood test. Hepatitis B blood test. Sexually transmitted disease (STD) testing. Diabetes screening. This is done by checking your blood sugar (glucose) after you have not eaten for a while (fasting). You may have this done every 1-3 years. Abdominal aortic aneurysm (AAA) screening. You may need this if you are a current or former smoker. Osteoporosis. You may be screened starting at age 31 if you are at high risk. Talk with your health care provider about your test results, treatment options, and if necessary, the need for more tests. Vaccines  Your health care provider may recommend certain vaccines, such as: Influenza vaccine. This is recommended every year. Tetanus, diphtheria, and acellular pertussis (Tdap, Td) vaccine. You may need a Td booster every 10 years.  Zoster vaccine. You may need this after age 60. Pneumococcal 13-valent  conjugate (PCV13) vaccine. One dose is recommended after age 65. Pneumococcal polysaccharide (PPSV23) vaccine. One dose is recommended after age 97. Talk to your health care provider about which screenings and vaccines you need and how often you need them. This information is not intended to replace advice given to you by your health care provider. Make sure you discuss any questions you have with your health care provider. Document Released: 07/17/2015 Document Revised: 03/09/2016 Document Reviewed: 04/21/2015 Elsevier Interactive Patient Education  2017 [place] Prevention in the Home Falls can cause injuries. They can happen to people of all ages. There are many things you can do to make your home safe and to help prevent falls. What can I do on the outside of my home? Regularly fix the edges of walkways and driveways and fix any cracks. Remove anything that might make you trip as you walk through a door, such as a raised step or threshold. Trim any bushes or trees on the path to your home. Use bright outdoor lighting. Clear any walking paths of anything that might make someone trip, such as rocks or tools. Regularly check to see if handrails are loose or broken. Make sure that both sides of any steps have handrails. Any raised decks and porches should have guardrails on the edges. Have any leaves, snow, or ice cleared regularly. Use sand or salt on walking paths during winter. Clean up any spills in your garage right away. This includes oil or grease spills. What can I do in the bathroom? Use night lights. Install grab bars by the toilet and in the tub and shower. Do not use towel bars as grab bars. Use non-skid mats or decals in the tub or shower. If you need to sit down in the shower, use a plastic, non-slip stool. Keep the floor dry. Clean up any water that spills on the floor as soon as it happens. Remove soap buildup in the tub or shower regularly. Attach bath mats  securely with double-sided non-slip rug tape. Do not have throw rugs and other things on the floor that can make you trip. What can I do in the bedroom? Use night lights. Make sure that you have a light by your bed that is easy to reach. Do not use any sheets or blankets that are too big for your bed. They should not hang down onto the floor. Have a firm chair that has side arms. You can use this for support while you get dressed. Do not have throw rugs and other things on the floor that can make you trip. What can I do in the kitchen? Clean up any spills right away. Avoid walking on wet floors. Keep items that you use a lot in easy-to-reach places. If you need to reach something above you, use a strong step stool that has a grab bar. Keep electrical cords out of the way. Do not use floor polish or wax that makes floors slippery. If you must use wax, use non-skid floor wax. Do not have throw rugs and other things on the floor that can make you trip. What can I do with my stairs? Do not leave any items on the stairs. Make sure that there are handrails on both sides of the stairs and use them. Fix handrails that are broken or loose. Make sure that handrails are as long as the stairways. Check any carpeting to make sure that  it is firmly attached to the stairs. Fix any carpet that is loose or worn. Avoid having throw rugs at the top or bottom of the stairs. If you do have throw rugs, attach them to the floor with carpet tape. Make sure that you have a light switch at the top of the stairs and the bottom of the stairs. If you do not have them, ask someone to add them for you. What else can I do to help prevent falls? Wear shoes that: Do not have high heels. Have rubber bottoms. Are comfortable and fit you well. Are closed at the toe. Do not wear sandals. If you use a stepladder: Make sure that it is fully opened. Do not climb a closed stepladder. Make sure that both sides of the stepladder  are locked into place. Ask someone to hold it for you, if possible. Clearly mark and make sure that you can see: Any grab bars or handrails. First and last steps. Where the edge of each step is. Use tools that help you move around (mobility aids) if they are needed. These include: Canes. Walkers. Scooters. Crutches. Turn on the lights when you go into a dark area. Replace any light bulbs as soon as they burn out. Set up your furniture so you have a clear path. Avoid moving your furniture around. If any of your floors are uneven, fix them. If there are any pets around you, be aware of where they are. Review your medicines with your doctor. Some medicines can make you feel dizzy. This can increase your chance of falling. Ask your doctor what other things that you can do to help prevent falls. This information is not intended to replace advice given to you by your health care provider. Make sure you discuss any questions you have with your health care provider. Document Released: 04/16/2009 Document Revised: 11/26/2015 Document Reviewed: 07/25/2014 Elsevier Interactive Patient Education  2017 [employer].

## 2021-04-22 ENCOUNTER — Other Ambulatory Visit: Payer: Self-pay

## 2021-04-22 ENCOUNTER — Ambulatory Visit (INDEPENDENT_AMBULATORY_CARE_PROVIDER_SITE_OTHER): Payer: Medicare Other

## 2021-04-22 DIAGNOSIS — Z23 Encounter for immunization: Secondary | ICD-10-CM

## 2021-05-07 ENCOUNTER — Encounter: Payer: Self-pay | Admitting: Gastroenterology

## 2021-05-20 ENCOUNTER — Ambulatory Visit (INDEPENDENT_AMBULATORY_CARE_PROVIDER_SITE_OTHER): Payer: Medicare Other | Admitting: Family Medicine

## 2021-05-20 ENCOUNTER — Ambulatory Visit: Payer: Medicare Other | Attending: Internal Medicine

## 2021-05-20 ENCOUNTER — Encounter: Payer: Self-pay | Admitting: Family Medicine

## 2021-05-20 ENCOUNTER — Other Ambulatory Visit (HOSPITAL_BASED_OUTPATIENT_CLINIC_OR_DEPARTMENT_OTHER): Payer: Self-pay

## 2021-05-20 ENCOUNTER — Other Ambulatory Visit: Payer: Self-pay

## 2021-05-20 VITALS — BP 110/70 | HR 70 | Temp 97.3°F | Ht 71.0 in | Wt 191.4 lb

## 2021-05-20 DIAGNOSIS — M25562 Pain in left knee: Secondary | ICD-10-CM

## 2021-05-20 DIAGNOSIS — I10 Essential (primary) hypertension: Secondary | ICD-10-CM

## 2021-05-20 DIAGNOSIS — M25561 Pain in right knee: Secondary | ICD-10-CM | POA: Diagnosis not present

## 2021-05-20 DIAGNOSIS — Z23 Encounter for immunization: Secondary | ICD-10-CM

## 2021-05-20 MED ORDER — PFIZER COVID-19 VAC BIVALENT 30 MCG/0.3ML IM SUSP
INTRAMUSCULAR | 0 refills | Status: DC
Start: 2021-05-20 — End: 2021-05-20
  Filled 2021-05-20: qty 0.3, 1d supply, fill #0

## 2021-05-20 NOTE — Progress Notes (Addendum)
Phone [phone] In person visit   Subjective:   David Gilmore is a 70 y.o. year old very pleasant male patient who presents for/with See problem oriented charting Chief Complaint  Patient presents with   Follow-up    Pt c/o bilateral knee pain that started a few weeks ago after sleeping in a chair.    This visit occurred during the SARS-CoV-2 public health emergency.  Safety protocols were in place, including screening questions prior to the visit, additional usage of staff PPE, and extensive cleaning of exam room while observing appropriate contact time as indicated for disinfecting solutions.   Past Medical History-  Patient Active Problem List   Diagnosis Date Noted   PAD (peripheral artery disease) ([place]) 09/19/2018    Priority: High   Mallory-Weiss tear     Priority: High   History of heart failure     Priority: High   Anemia 08/23/2017    Priority: High   BPH associated with nocturia 08/16/2017    Priority: Medium    Shortness of breath 08/16/2017    Priority: Medium    Essential hypertension 06/22/2015    Priority: Medium    Hyperlipidemia     Priority: Medium    Aortic atherosclerosis ([place]) 08/16/2017    Priority: Low   Abnormal stress test 07/27/2015    Priority: Low   Former smoker 06/15/2015    Priority: Low    Medications- reviewed and updated Current Outpatient Medications  Medication Sig Dispense Refill   amLODipine (NORVASC) 10 MG tablet Take 1 tablet (10 mg total) by mouth daily. 90 tablet 3   aspirin 81 MG tablet Take 81 mg by mouth daily.     cholecalciferol (VITAMIN D3) 25 MCG (1000 UT) tablet Take 1,000 Units by mouth daily.     Ferrous Sulfate (IRON PO) Take by mouth. Twice a week     hydrochlorothiazide (HYDRODIURIL) 25 MG tablet Take 1 tablet (25 mg total) by mouth daily. 90 tablet 2   rosuvastatin (CRESTOR) 40 MG tablet Take 1 tablet (40 mg total) by mouth daily. 90 tablet 3   sildenafil (REVATIO) 20 MG tablet TAKE 2 TO 5 TABLETS BY  MOUTH EVERY 48 HOURS AS NEEDED FOR ERECTILE DYSFUNCTION 50 tablet 0   No current facility-administered medications for this visit.     Objective:  BP 110/70   Pulse 70   Temp (!) 97.3 F (36.3 C)   Ht 5\' 11"  (1.803 m)   Wt 191 lb 6.4 oz (86.8 kg)   SpO2 98%   BMI 26.69 kg/m  Gen: NAD, resting comfortably Right Knee: Normal to inspection with no erythema or effusion or obvious bony abnormalities. Palpation normal with no warmth, joint line tenderness, patellar tenderness, or condyle tenderness. ROM full in flexion and extension and lower leg rotation. Ligaments with solid consistent endpoints including ACL, PCL, LCL, MCL. Negative Mcmurray's, Apley's, and Thessalonian tests. Non painful patellar compression. Patellar and quadriceps tendons unremarkable.  Left knee with similar exam except does have medial and lateral joint line pain     Assessment and Plan   #Bilateral knee pain S:Patient presents symptoms started a few weeks ago after sleeping in a recliner chair at [facility] (there for wife). Left knee worse than right. Perhaps mild swelling in the ankles (not significantly worse. Hurts over top of knee and into back of knee. Tylenol seems to help some. Has also tried some biofreeze on back as well- back pain started after knee pain- occasional muscle spasms-  picking up granddaughter today was painful. Knees are getting slightly better. Pain worse if sitting for a while then stands up. Actually does better with moving more. No heat or ice tried yet.  - no shoulder pain, no leg weakness, no incontinence, no saddle anesthesia, no paresthesias.  - pain can get up to 8/10 when first gets up. 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  We are  going to trial Voltaren gel 4 times a day for the next week and if fails to improve we will get baseline x-rays (he wanted to start your over seeing sports medicine or physical therapy.  Physical therapy or sports medicine likely next step  Is having some back pain as well but with pain being so pinpoint over the knees doubt radicular pain.  Patient with slight bilateral edema but with no calf pain or swelling-doubt DVT.  If symptom pattern changes could certainly reconsider diagnosis  #hypertension # history of heart failure- EF normalized with better blood pressure control S: medication: amlodipine 10Mg , HCTZ 25Mg  BP Readings from Last 3 Encounters:  05/20/21 110/70  12/29/20 134/88  06/22/20 133/72  A/P: Blood pressure well controlled-continue current medication  Recommended follow up: No follow-ups on file. Future Appointments  Date Time Provider [place]  05/20/2021  3:30 PM [facility] [place] [facility]  06/24/2021  4:00 PM [facility] PREVISIT RM [facility]  07/08/2021  9:00 AM Danis, Kirke Corin, MD [facility]  07/12/2021 10:40 AM Yong Channel, Brayton Mars, MD [facility]    Lab/Order associations:   ICD-10-CM   1. Acute pain of both knees  M25.561    M25.562     2. Essential hypertension  I10       No orders of the defined types were placed in this encounter.  I,Harris Phan,acting as a Education administrator for [name], MD.,have documented all relevant documentation on the behalf of [name], MD,as directed by  [name], MD while in the presence of [name], MD.   I, [name], MD, have reviewed all documentation for this visit. The documentation on 05/20/21 for the exam, diagnosis, procedures, and orders are all accurate and complete.   Return precautions advised.  [name], MD

## 2021-05-20 NOTE — Patient Instructions (Addendum)
Health Maintenance Due  Topic Date Due   TETANUS/TDAP - consider at pharmacy Never done   Zoster Vaccines- consider at pharmacy Never done   COLONOSCOPY  - appointment later this year 11/24/2020   Trial Voltaren/Diclofenac gel up to 4 times a day on both of your knees. If you are not improved within 7 days, please let me know and we can place an order for  X-ray as the next step (then consider PT vs. Sports medicine) - new or worsening symptoms please let me know  Recommended follow up: keep January visit or sooner if needed

## 2021-05-20 NOTE — Progress Notes (Signed)
   Covid-19 Vaccination Clinic  Name:  David Gilmore    MRN: [id number] DOB: [birth year]  05/20/2021  [name] was observed post Covid-19 immunization for 15 minutes without incident. He was provided with Vaccine Information Sheet and instruction to access the V-Safe system.   [name] was instructed to call 911 with any severe reactions post vaccine: Difficulty breathing  Swelling of face and throat  A fast heartbeat  A bad rash all over body  Dizziness and weakness   Immunizations Administered     Name Date Dose VIS Date Route   Pfizer Covid-19 Vaccine Bivalent Booster 05/20/2021 10:42 AM 0.3 mL 03/03/2021 Intramuscular   Manufacturer: [facility]   Lot: [id number]   [place]: [phone]

## 2021-06-10 DIAGNOSIS — Z20822 Contact with and (suspected) exposure to covid-19: Secondary | ICD-10-CM | POA: Diagnosis not present

## 2021-06-21 NOTE — Progress Notes (Signed)
Phone [phone] In person visit   Subjective:   David Gilmore is a 70 y.o. year old very pleasant male patient who presents for/with See problem oriented charting Chief Complaint  Patient presents with   Follow-up    Pt c/o leg stiffness in bilateral legs after sitting for a while with swelling in ankles and feet.   Hyperlipidemia   Hypertension    This visit occurred during the SARS-CoV-2 public health emergency.  Safety protocols were in place, including screening questions prior to the visit, additional usage of staff PPE, and extensive cleaning of exam room while observing appropriate contact time as indicated for disinfecting solutions.   Past Medical History-  Patient Active Problem List   Diagnosis Date Noted   PAD (peripheral artery disease) ([place]) 09/19/2018    Priority: High   Mallory-Weiss tear     Priority: High   History of heart failure     Priority: High   Anemia 08/23/2017    Priority: High   BPH associated with nocturia 08/16/2017    Priority: Medium    Essential hypertension 06/22/2015    Priority: Medium    Hyperlipidemia     Priority: Medium    Aortic atherosclerosis ([place]) 08/16/2017    Priority: Low   Shortness of breath 08/16/2017    Priority: Low   Abnormal stress test 07/27/2015    Priority: Low   Former smoker 06/15/2015    Priority: Low    Medications- reviewed and updated Current Outpatient Medications  Medication Sig Dispense Refill   amLODipine (NORVASC) 10 MG tablet Take 1 tablet (10 mg total) by mouth daily. 90 tablet 3   aspirin 81 MG tablet Take 81 mg by mouth daily.     cholecalciferol (VITAMIN D3) 25 MCG (1000 UT) tablet Take 1,000 Units by mouth daily.     Ferrous Sulfate (IRON PO) Take by mouth. Twice a week     hydrochlorothiazide (HYDRODIURIL) 25 MG tablet TAKE 1 TABLET(25 MG) BY MOUTH DAILY 90 tablet 2   rosuvastatin (CRESTOR) 40 MG tablet Take 1 tablet (40 mg total) by mouth daily. 90 tablet 3   sildenafil  (REVATIO) 20 MG tablet TAKE 2 TO 5 TABLETS BY MOUTH EVERY 48 HOURS AS NEEDED FOR ERECTILE DYSFUNCTION 50 tablet 0   No current facility-administered medications for this visit.     Objective:  BP 124/80    Pulse 94    Temp 97.7 F (36.5 C)    Ht 5\' 11"  (1.803 m)    Wt 190 lb 12.8 oz (86.5 kg)    SpO2 97%    BMI 26.61 kg/m  Gen: NAD, resting comfortably CV: RRR no murmurs rubs or gallops Lungs: CTAB no crackles, wheeze, rhonchi Ext: no edema Skin: warm, dry    Assessment and Plan   #hyperlipidemia #aortic atherosclerosis- incidental finding but LDL goal under 70 #Peripheral arterial disease with LDL goal under 70. Noted on arterial study 07/02/2018-"Left: Resting left ankle-brachial index indicates moderate left lower extremity arterial disease. The left toe-brachial index is abnormal. CW waveforms suggest tibial disease." S: Medication: Rosuvastatin 40MG  daily and aspirin 81 daily -Patient denies claudication today- some pain at rest but better with walking- see below  Lab Results  Component Value Date   CHOL 125 12/29/2020   HDL 35.70 (L) 12/29/2020   LDLCALC 68 12/29/2020   LDLDIRECT 66.0 12/20/2018   TRIG 106.0 12/29/2020   CHOLHDL 4 12/29/2020   A/P: Goal for hyperlipidemia considering aortic atherosclerosis and PAD is  under 70 for LDL and he has been at this goal-continue current medication.  PAD and aortic atherosclerosis appears stable-continue current medications  #hypertension # history of heart failure= EF normalized with better blood pressure control S: medication: amlodipine 10Mg  daily and HCTZ 25Mg  daily  BP Readings from Last 3 Encounters:  07/12/21 124/80  07/08/21 114/63  05/20/21 110/70  A/P:  Controlled. Continue current medications.   -ankle swelling at times on amlodipine- better with elevation and watching sodas/teas etc  # anemia- ferritin had been in low normal- twice a week iron recommended- off since colonoscopy Lab Results  Component Value Date    WBC 4.2 12/29/2020   HGB 15.1 12/29/2020   HCT 44.5 12/29/2020   MCV 80.8 12/29/2020   PLT 207.0 12/29/2020  -No evidence of anemia on last check-update CBC with labs today as well as iron stores  #Bilateral leg pain (hamstrings down to calves)-  when sits down for a while and stands up feels stiffness in the back of the legs- but with movement improves. Mild pain overall- we discussed this could be coming from the back- will refer to PT  Recommended follow up: Return in about 6 months (around 01/09/2022) for follow up- or sooner if needed.  Lab/Order associations:   ICD-10-CM   1. Essential hypertension  I10 CBC with Differential/Platelet    Comprehensive metabolic panel    2. Hyperlipidemia, unspecified hyperlipidemia type  E78.5     3. Aortic atherosclerosis (H[facility])  I70.0     4. PAD (peripheral artery disease) (H[facility])  I73.9     5. Iron deficiency anemia, unspecified iron deficiency anemia type  D50.9 IBC + Ferritin    6. Bilateral leg pain  M79.604 Ambulatory referral to Physical Therapy   M79.605       No orders of the defined types were placed in this encounter.  I,Jada Bradford,acting as a scribe for [name], MD.,have documented all relevant documentation on the behalf of [name], MD,as directed by  [name], MD while in the presence of [name], MD.   I, [name], MD, have reviewed all documentation for this visit. The documentation on 07/12/21 for the exam, diagnosis, procedures, and orders are all accurate and complete.   Return precautions advised.  [name], MD

## 2021-06-24 ENCOUNTER — Other Ambulatory Visit: Payer: Self-pay

## 2021-06-24 ENCOUNTER — Ambulatory Visit (AMBULATORY_SURGERY_CENTER): Payer: Medicare Other

## 2021-06-24 VITALS — Ht 71.0 in | Wt 194.0 lb

## 2021-06-24 DIAGNOSIS — Z86010 Personal history of colonic polyps: Secondary | ICD-10-CM

## 2021-06-24 MED ORDER — PEG 3350-KCL-NA BICARB-NACL 420 G PO SOLR: 4000.0000 mL | Freq: Once | ORAL | 0 refills | Status: AC

## 2021-06-24 NOTE — Progress Notes (Signed)

## 2021-06-26 ENCOUNTER — Other Ambulatory Visit: Payer: Self-pay | Admitting: Family Medicine

## 2021-07-06 ENCOUNTER — Telehealth: Payer: Self-pay | Admitting: Gastroenterology

## 2021-07-06 NOTE — Telephone Encounter (Signed)
Pt aware ok to proceed as scheduled and he does have the prep instructions to review. I encouraged pt not to eat anymore items on list and to stay hydrated.

## 2021-07-06 NOTE — Telephone Encounter (Signed)
Patient has colonoscopy on Thursday and ate peas and corn yesterday.  He wants to know if he will have to reschedule his procedure.  Please call and advise.  Thank you.

## 2021-07-08 ENCOUNTER — Encounter: Payer: Self-pay | Admitting: Gastroenterology

## 2021-07-08 ENCOUNTER — Other Ambulatory Visit: Payer: Self-pay

## 2021-07-08 ENCOUNTER — Ambulatory Visit (AMBULATORY_SURGERY_CENTER): Payer: Medicare Other | Admitting: Gastroenterology

## 2021-07-08 VITALS — BP 114/63 | HR 70 | Temp 97.3°F | Resp 14 | Ht 71.0 in | Wt 194.0 lb

## 2021-07-08 DIAGNOSIS — D123 Benign neoplasm of transverse colon: Secondary | ICD-10-CM

## 2021-07-08 DIAGNOSIS — Z86010 Personal history of colonic polyps: Secondary | ICD-10-CM | POA: Diagnosis not present

## 2021-07-08 DIAGNOSIS — D120 Benign neoplasm of cecum: Secondary | ICD-10-CM

## 2021-07-08 MED ORDER — SODIUM CHLORIDE 0.9 % IV SOLN: 500.0000 mL | INTRAVENOUS | Status: DC

## 2021-07-08 NOTE — Op Note (Signed)
[facility] Patient Name: David Gilmore Procedure Date: 07/08/2021 8:58 AM MRN: [id number] Endoscopist: [place]. Loletha Carrow , MD Age: 71 Referring MD:  Date of Birth: 1950/10/15 Gender: Male Account #: [id number] Procedure:                Colonoscopy Indications:              Surveillance: Personal history of adenomatous                            polyps on last colonoscopy > 5 years ago                           TA <50mm x 2 : 11/2015 Medicines:                Monitored Anesthesia Care Procedure:                Pre-Anesthesia Assessment:                           - Prior to the procedure, a History and Physical                            was performed, and patient medications and                            allergies were reviewed. The patient's tolerance of                            previous anesthesia was also reviewed. The risks                            and benefits of the procedure and the sedation                            options and risks were discussed with the patient.                            All questions were answered, and informed consent                            was obtained. Prior Anticoagulants: The patient has                            taken no previous anticoagulant or antiplatelet                            agents. ASA Grade Assessment: II - A patient with                            mild systemic disease. After reviewing the risks                            and benefits, the patient was deemed in  satisfactory condition to undergo the procedure.                           After obtaining informed consent, the colonoscope                            was passed under direct vision. Throughout the                            procedure, the patient's blood pressure, pulse, and                            oxygen saturations were monitored continuously. The                            CF [id number] #[id number] was introduced through the anus                             and advanced to the the cecum, identified by                            appendiceal orifice and ileocecal valve. The                            colonoscopy was performed without difficulty. The                            patient tolerated the procedure well. The quality                            of the bowel preparation was good. The ileocecal                            valve, appendiceal orifice, and rectum were                            photographed. Scope In: 9:12:49 AM Scope Out: 10:04:33 AM Scope Withdrawal Time: 0 hours 47 minutes 54 seconds  Total Procedure Duration: 0 hours 51 minutes 44 seconds  Findings:                 The perianal and digital rectal examinations were                            normal.                           A 12 mm polyp was found in the cecum. The polyp was                            multi-lobulated and sessile. The polyp was removed                            with a piecemeal technique using a hot and cold  snare. Resection and retrieval were complete.                           A 16-18 mm polyp was found in the transverse colon.                            The polyp was flat. Preparations were made for                            mucosal resection. (borders identified by WL and                            NBI) Saline was injected to raise the lesion.                            Piecemeal hot and cold snare mucosal resection was                            performed. Resection and retrieval were complete.                            Area was tattooed with an injection of 0.5 mL of                            Spot (carbon black).                           A 8 mm polyp was found in the splenic flexure. The                            polyp was semi-pedunculated. The polyp was removed                            with a cold snare. Resection and retrieval were                            complete.                            A few diverticula were found in the left colon.                           The exam was otherwise without abnormality on                            direct and retroflexion views. Complications:            No immediate complications. Estimated Blood Loss:     Estimated blood loss was minimal. Impression:               - One 12 mm polyp in the cecum, removed piecemeal                            using a hot snare. Resected and  retrieved.                           - One 16-18 mm polyp in the transverse colon,                            removed with mucosal resection. Resected and                            retrieved. Tattooed.                           - One 8 mm polyp at the splenic flexure, removed                            with a cold snare. Resected and retrieved.                           - Diverticulosis in the left colon.                           - The examination was otherwise normal on direct                            and retroflexion views.                           - Mucosal resection was performed. Resection and                            retrieval were complete. Recommendation:           - Patient has a contact number available for                            emergencies. The signs and symptoms of potential                            delayed complications were discussed with the                            patient. Return to normal activities tomorrow.                            Written discharge instructions were provided to the                            patient.                           - Resume previous diet.                           - Continue present medications.                           - Await pathology results.                           -  Repeat colonoscopy in 1 year for surveillance. Gates Jividen L. Loletha Carrow, MD 07/08/2021 10:11:41 AM This report has been signed electronically.

## 2021-07-08 NOTE — Progress Notes (Signed)
To pacu, VSS. Report to [email or web address] 

## 2021-07-08 NOTE — Progress Notes (Signed)
Called to room to assist during endoscopic procedure.  Patient ID and intended procedure confirmed with present staff. Received instructions for my participation in the procedure from the performing physician.  
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
## 2021-07-08 NOTE — Patient Instructions (Signed)
Await pathology- 3 polyps removed Next colonoscopy- 1 year  Please read over handouts about polyps and diverticulosis  Continue your normal medications  YOU HAD AN ENDOSCOPIC PROCEDURE TODAY AT [facility]:   Refer to the procedure report that was given to you for any specific questions about what was found during the examination.  If the procedure report does not answer your questions, please call your gastroenterologist to clarify.  If you requested that your care partner not be given the details of your procedure findings, then the procedure report has been included in a sealed envelope for you to review at your convenience later.  YOU SHOULD EXPECT: Some feelings of bloating in the abdomen. Passage of more gas than usual.  Walking can help get rid of the air that was put into your GI tract during the procedure and reduce the bloating. If you had a lower endoscopy (such as a colonoscopy or flexible sigmoidoscopy) you may notice spotting of blood in your stool or on the toilet paper. If you underwent a bowel prep for your procedure, you may not have a normal bowel movement for a few days.  Please Note:  You might notice some irritation and congestion in your nose or some drainage.  This is from the oxygen used during your procedure.  There is no need for concern and it should clear up in a day or so.  SYMPTOMS TO REPORT IMMEDIATELY:  Following lower endoscopy (colonoscopy or flexible sigmoidoscopy):  Excessive amounts of blood in the stool  Significant tenderness or worsening of abdominal pains  Swelling of the abdomen that is new, acute  Fever of 100F or higher  For urgent or emergent issues, a gastroenterologist can be reached at any hour by calling [phone]. Do not use MyChart messaging for urgent concerns.    DIET:  We do recommend a small meal at first, but then you may proceed to your regular diet.  Drink plenty of fluids but you should avoid alcoholic  beverages for 24 hours.  ACTIVITY:  You should plan to take it easy for the rest of today and you should NOT DRIVE or use heavy machinery until tomorrow (because of the sedation medicines used during the test).    FOLLOW UP: Our staff will call the number listed on your records 48-72 hours following your procedure to check on you and address any questions or concerns that you may have regarding the information given to you following your procedure. If we do not reach you, we will leave a message.  We will attempt to reach you two times.  During this call, we will ask if you have developed any symptoms of COVID 19. If you develop any symptoms (ie: fever, flu-like symptoms, shortness of breath, cough etc.) before then, please call [phone].  If you test positive for Covid 19 in the 2 weeks post procedure, please call and report this information to [place].    If any biopsies were taken you will be contacted by phone or by letter within the next 1-3 weeks.  Please call us at [phone] if you have not heard about the biopsies in 3 weeks.    SIGNATURES/CONFIDENTIALITY: You and/or your care partner have signed paperwork which will be entered into your electronic medical record.  These signatures attest to the fact that that the information above on your After Visit Summary has been reviewed and is understood.  Full responsibility of the confidentiality of this discharge information lies with  you and/or your care-partner.

## 2021-07-08 NOTE — Progress Notes (Signed)
Pt's states no medical or surgical changes since previsit or office visit. 

## 2021-07-09 ENCOUNTER — Encounter: Payer: Medicare Other | Admitting: Gastroenterology

## 2021-07-12 ENCOUNTER — Ambulatory Visit (INDEPENDENT_AMBULATORY_CARE_PROVIDER_SITE_OTHER): Payer: Medicare Other | Admitting: Family Medicine

## 2021-07-12 ENCOUNTER — Other Ambulatory Visit: Payer: Self-pay

## 2021-07-12 ENCOUNTER — Telehealth: Payer: Self-pay

## 2021-07-12 ENCOUNTER — Encounter: Payer: Self-pay | Admitting: Gastroenterology

## 2021-07-12 ENCOUNTER — Encounter: Payer: Self-pay | Admitting: Family Medicine

## 2021-07-12 VITALS — BP 124/80 | HR 94 | Temp 97.7°F | Ht 71.0 in | Wt 190.8 lb

## 2021-07-12 DIAGNOSIS — M79604 Pain in right leg: Secondary | ICD-10-CM

## 2021-07-12 DIAGNOSIS — I10 Essential (primary) hypertension: Secondary | ICD-10-CM | POA: Diagnosis not present

## 2021-07-12 DIAGNOSIS — M79605 Pain in left leg: Secondary | ICD-10-CM | POA: Diagnosis not present

## 2021-07-12 DIAGNOSIS — D509 Iron deficiency anemia, unspecified: Secondary | ICD-10-CM | POA: Diagnosis not present

## 2021-07-12 DIAGNOSIS — E785 Hyperlipidemia, unspecified: Secondary | ICD-10-CM

## 2021-07-12 DIAGNOSIS — I700 Atherosclerosis of aorta: Secondary | ICD-10-CM

## 2021-07-12 DIAGNOSIS — I739 Peripheral vascular disease, unspecified: Secondary | ICD-10-CM

## 2021-07-12 LAB — CBC WITH DIFFERENTIAL/PLATELET
Basophils Absolute: 0 10*3/uL (ref 0.0–0.1)
Basophils Relative: 1 % (ref 0.0–3.0)
Eosinophils Absolute: 0.1 10*3/uL (ref 0.0–0.7)
Eosinophils Relative: 3.3 % (ref 0.0–5.0)
HCT: 42.3 % (ref 39.0–52.0)
Hemoglobin: 13.9 g/dL (ref 13.0–17.0)
Lymphocytes Relative: 34.2 % (ref 12.0–46.0)
Lymphs Abs: 1.4 10*3/uL (ref 0.7–4.0)
MCHC: 32.8 g/dL (ref 30.0–36.0)
MCV: 80.9 fl (ref 78.0–100.0)
Monocytes Absolute: 0.6 10*3/uL (ref 0.1–1.0)
Monocytes Relative: 14.3 % — ABNORMAL HIGH (ref 3.0–12.0)
Neutro Abs: 1.9 10*3/uL (ref 1.4–7.7)
Neutrophils Relative %: 47.2 % (ref 43.0–77.0)
Platelets: 236 10*3/uL (ref 150.0–400.0)
RBC: 5.23 Mil/uL (ref 4.22–5.81)
RDW: 14.3 % (ref 11.5–15.5)
WBC: 4 10*3/uL (ref 4.0–10.5)

## 2021-07-12 LAB — COMPREHENSIVE METABOLIC PANEL
ALT: 29 U/L (ref 0–53)
AST: 24 U/L (ref 0–37)
Albumin: 4.3 g/dL (ref 3.5–5.2)
Alkaline Phosphatase: 50 U/L (ref 39–117)
BUN: 12 mg/dL (ref 6–23)
CO2: 32 mEq/L (ref 19–32)
Calcium: 10.2 mg/dL (ref 8.4–10.5)
Chloride: 101 mEq/L (ref 96–112)
Creatinine, Ser: 1.07 mg/dL (ref 0.40–1.50)
GFR: 69.71 mL/min (ref >60.00)
Glucose, Bld: 76 mg/dL (ref 70–99)
Potassium: 3.9 mEq/L (ref 3.5–5.1)
Sodium: 138 mEq/L (ref 135–145)
Total Bilirubin: 0.6 mg/dL (ref 0.2–1.2)
Total Protein: 7.4 g/dL (ref 6.0–8.3)

## 2021-07-12 LAB — IBC + FERRITIN
Ferritin: 47.7 ng/mL (ref 22.0–322.0)
Iron: 57 ug/dL (ref 42–165)
Saturation Ratios: 13.3 % — ABNORMAL LOW (ref 20.0–50.0)
TIBC: 428.4 ug/dL (ref 250.0–450.0)
Transferrin: 306 mg/dL (ref 212.0–360.0)

## 2021-07-12 NOTE — Patient Instructions (Addendum)
Health Maintenance Due  Topic Date Due   TETANUS/TDAP - recommend at pharmacy (cheaper there)  Never done   Zoster Vaccines- Shingrix (1 of 2)- recommend at pharmacy (cheaper there)  Never done   Please stop by lab before you go If you have mychart- we will send your results within 3 business days of [place] receiving them.  If you do not have mychart- we will call you about results within 5 business days of [place] receiving them.  *please also note that you will see labs on mychart as soon as they post. I will later go in and write notes on them- will say "notes from Dr. Yong Channel"   Recommended follow up: Return in about 6 months (around 01/09/2022) for follow up- or sooner if needed.

## 2021-07-12 NOTE — Telephone Encounter (Signed)
°  Follow up Call-  Call back number 07/08/2021  Post procedure Call Back phone  # [phone]  Permission to leave phone message Yes  Some recent data might be hidden     Patient questions:  Do you have a fever, pain , or abdominal swelling? No. Pain Score  0 *  Have you tolerated food without any problems? Yes.    Have you been able to return to your normal activities? Yes.    Do you have any questions about your discharge instructions: Diet   No. Medications  No. Follow up visit  No.  Do you have questions or concerns about your Care? No.  Actions: * If pain score is 4 or above: No action needed, pain <4.

## 2021-07-16 ENCOUNTER — Other Ambulatory Visit: Payer: Self-pay | Admitting: Family Medicine

## 2021-07-23 ENCOUNTER — Other Ambulatory Visit: Payer: Self-pay

## 2021-07-23 ENCOUNTER — Telehealth (INDEPENDENT_AMBULATORY_CARE_PROVIDER_SITE_OTHER): Payer: Medicare Other | Admitting: Family Medicine

## 2021-07-23 ENCOUNTER — Encounter: Payer: Self-pay | Admitting: Family Medicine

## 2021-07-23 VITALS — HR 106 | Temp 99.0°F | Ht 71.0 in | Wt 190.0 lb

## 2021-07-23 DIAGNOSIS — R0989 Other specified symptoms and signs involving the circulatory and respiratory systems: Secondary | ICD-10-CM

## 2021-07-23 DIAGNOSIS — I10 Essential (primary) hypertension: Secondary | ICD-10-CM | POA: Diagnosis not present

## 2021-07-23 DIAGNOSIS — U071 COVID-19: Secondary | ICD-10-CM | POA: Diagnosis not present

## 2021-07-23 LAB — POC COVID19 BINAXNOW: SARS Coronavirus 2 Ag: POSITIVE — AB

## 2021-07-23 MED ORDER — MOLNUPIRAVIR EUA 200MG CAPSULE: 4.0000 | ORAL_CAPSULE | Freq: Two times a day (BID) | ORAL | 0 refills | Status: AC

## 2021-07-23 MED ORDER — BENZONATATE 100 MG PO CAPS: 100.0000 mg | ORAL_CAPSULE | Freq: Two times a day (BID) | ORAL | 0 refills | Status: DC | PRN

## 2021-07-23 NOTE — Progress Notes (Signed)
Phone [phone] In person visit but in car at office   Subjective:   David Gilmore is a 71 y.o. year old very pleasant male patient who presents for/with See problem oriented charting Chief Complaint  Patient presents with   Chest congestion    Pt c/o chest congest since Tuesday, pt wife tested positive for COVID today also.   This visit occurred during the SARS-CoV-2 public health emergency.  Safety protocols were in place, including screening questions prior to the visit, additional usage of staff PPE, and extensive cleaning of exam room while observing appropriate contact time as indicated for disinfecting solutions.   Past Medical History-  Patient Active Problem List   Diagnosis Date Noted   PAD (peripheral artery disease) ([place]) 09/19/2018    Priority: High   Mallory-Weiss tear     Priority: High   History of heart failure     Priority: High   Anemia 08/23/2017    Priority: High   BPH associated with nocturia 08/16/2017    Priority: Medium    Essential hypertension 06/22/2015    Priority: Medium    Hyperlipidemia     Priority: Medium    Aortic atherosclerosis ([place]) 08/16/2017    Priority: Low   Shortness of breath 08/16/2017    Priority: Low   Abnormal stress test 07/27/2015    Priority: Low   Former smoker 06/15/2015    Priority: Low    Medications- reviewed and updated Current Outpatient Medications  Medication Sig Dispense Refill   amLODipine (NORVASC) 10 MG tablet Take 1 tablet (10 mg total) by mouth daily. 90 tablet 3   aspirin 81 MG tablet Take 81 mg by mouth daily.     benzonatate (TESSALON) 100 MG capsule Take 1 capsule (100 mg total) by mouth 2 (two) times daily as needed for cough. 20 capsule 0   cholecalciferol (VITAMIN D3) 25 MCG (1000 UT) tablet Take 1,000 Units by mouth daily.     Ferrous Sulfate (IRON PO) Take by mouth. Twice a week     hydrochlorothiazide (HYDRODIURIL) 25 MG tablet TAKE 1 TABLET(25 MG) BY MOUTH DAILY 90 tablet 2    molnupiravir EUA (LAGEVRIO) 200 mg CAPS capsule Take 4 capsules (800 mg total) by mouth 2 (two) times daily for 5 days. 40 capsule 0   rosuvastatin (CRESTOR) 40 MG tablet TAKE 1 TABLET(40 MG) BY MOUTH DAILY 90 tablet 3   sildenafil (REVATIO) 20 MG tablet TAKE 2 TO 5 TABLETS BY MOUTH EVERY 48 HOURS AS NEEDED FOR ERECTILE DYSFUNCTION 50 tablet 0   No current facility-administered medications for this visit.     Objective:  Pulse (!) 106    Temp 99 F (37.2 C)    Ht 5\' 11"  (1.803 m)    Wt 190 lb (86.2 kg)    SpO2 99%    BMI 26.50 kg/m  unable to obtain BP as patient in car Gen: NAD, appears fatigued Appears slightly dry CV: slightly tachycardic but regular-  no murmurs rubs or gallops Lungs: CTAB no crackles, wheeze, rhonchi Ext: no edema Skin: warm, dry    Assessment and Plan   # Covid 19 S:Cough and congestion started on Wednesday. Stable in course.  Slight runny nose and fatigue. Wife started symptoms on Tuesday and tested positive  ROS- No fever or chills.No shortness of breath,body aches, sore throat, headache, nausea, vomiting, diarrhea, or new loss of taste or smell.   A/P: Patient with testing confirming covid 19 with first day of covid 19  symptoms 07/21/21 Vaccination status:fully vaccinated including bivalent booster   Therefore: - recommended patient watch closely for shortness of breath or confusion or worsening symptoms and if those occur patient should contact us immediately or seek care in the emergency department -recommended patient consider purchasing pulse oximeter and if levels 94% or below persistently- seek care at the hospital - Patient needs to self isolate  for at least 5 days since first symptom AND at least 24 hours fever free without fever reducing medications AND have improvement in respiratory symptoms . After 5 days can end self isolation but still needs to wear mask for additional 5 days . Not immunocompromised like wife.  -Patient should inform close  contacts about exposure (anyone patient been around unmasked for more than 15 minutes)  - due to cholesterol meds and amlodipine we opted for molnupiravir (high risk with age, hypertension, male -tessalon for cough also sent in   #hypertension # history of heart failure= EF normalized with better blood pressure control S: medication: amlodipine 10Mg , HCTZ 25Mg  A/P: Slight interaction with amlodipine and paxlovid-we opted for molnupiravir as a result for COVID.  Previously well-controlled-continue current medication -Also we will not have to alter statin usage if we use  molnupiravir    Recommended follow up: Return for as needed for new, worsening, persistent symptoms. Future Appointments  Date Time Provider [place]  01/10/2022  1:20 PM [name], MD [facility]   Lab/Order associations:   ICD-10-CM   1. Essential hypertension  I10     2. COVID-19  U07.1     3. Chest congestion  R09.89 POC COVID-19      Meds ordered this encounter  Medications   molnupiravir EUA (LAGEVRIO) 200 mg CAPS capsule    Sig: Take 4 capsules (800 mg total) by mouth 2 (two) times daily for 5 days.    Dispense:  40 capsule    Refill:  0   benzonatate (TESSALON) 100 MG capsule    Sig: Take 1 capsule (100 mg total) by mouth 2 (two) times daily as needed for cough.    Dispense:  20 capsule    Refill:  0    Return precautions advised.  [name], MD

## 2021-07-23 NOTE — Patient Instructions (Signed)
Health Maintenance Due  Topic Date Due   TETANUS/TDAP  Never done   Zoster Vaccines- Shingrix (1 of 2) Never done    Recommended follow up: No follow-ups on file.

## 2021-09-04 ENCOUNTER — Other Ambulatory Visit: Payer: Self-pay | Admitting: Family Medicine

## 2021-09-18 DIAGNOSIS — Z20822 Contact with and (suspected) exposure to covid-19: Secondary | ICD-10-CM | POA: Diagnosis not present

## 2021-10-18 DIAGNOSIS — Z20822 Contact with and (suspected) exposure to covid-19: Secondary | ICD-10-CM | POA: Diagnosis not present

## 2021-10-26 DIAGNOSIS — Z20822 Contact with and (suspected) exposure to covid-19: Secondary | ICD-10-CM | POA: Diagnosis not present

## 2021-10-26 DIAGNOSIS — R059 Cough, unspecified: Secondary | ICD-10-CM | POA: Diagnosis not present

## 2021-10-26 DIAGNOSIS — R051 Acute cough: Secondary | ICD-10-CM | POA: Diagnosis not present

## 2021-11-06 DIAGNOSIS — Z20822 Contact with and (suspected) exposure to covid-19: Secondary | ICD-10-CM | POA: Diagnosis not present

## 2021-12-09 ENCOUNTER — Other Ambulatory Visit: Payer: Self-pay | Admitting: Family Medicine

## 2022-01-10 ENCOUNTER — Encounter: Payer: Self-pay | Admitting: Family Medicine

## 2022-01-10 ENCOUNTER — Ambulatory Visit (INDEPENDENT_AMBULATORY_CARE_PROVIDER_SITE_OTHER): Payer: Medicare Other | Admitting: Family Medicine

## 2022-01-10 ENCOUNTER — Other Ambulatory Visit: Payer: Self-pay | Admitting: Family Medicine

## 2022-01-10 VITALS — BP 118/70 | HR 90 | Temp 98.4°F | Ht 71.0 in | Wt 194.4 lb

## 2022-01-10 DIAGNOSIS — I739 Peripheral vascular disease, unspecified: Secondary | ICD-10-CM | POA: Diagnosis not present

## 2022-01-10 DIAGNOSIS — I10 Essential (primary) hypertension: Secondary | ICD-10-CM

## 2022-01-10 DIAGNOSIS — R7989 Other specified abnormal findings of blood chemistry: Secondary | ICD-10-CM

## 2022-01-10 DIAGNOSIS — R809 Proteinuria, unspecified: Secondary | ICD-10-CM

## 2022-01-10 DIAGNOSIS — I700 Atherosclerosis of aorta: Secondary | ICD-10-CM | POA: Diagnosis not present

## 2022-01-10 DIAGNOSIS — D509 Iron deficiency anemia, unspecified: Secondary | ICD-10-CM | POA: Diagnosis not present

## 2022-01-10 DIAGNOSIS — E785 Hyperlipidemia, unspecified: Secondary | ICD-10-CM | POA: Diagnosis not present

## 2022-01-10 LAB — CBC WITH DIFFERENTIAL/PLATELET
Basophils Absolute: 0 10*3/uL (ref 0.0–0.1)
Basophils Relative: 0.8 % (ref 0.0–3.0)
Eosinophils Absolute: 0.1 10*3/uL (ref 0.0–0.7)
Eosinophils Relative: 1.6 % (ref 0.0–5.0)
HCT: 45 % (ref 39.0–52.0)
Hemoglobin: 14.9 g/dL (ref 13.0–17.0)
Lymphocytes Relative: 34.2 % (ref 12.0–46.0)
Lymphs Abs: 1.4 10*3/uL (ref 0.7–4.0)
MCHC: 33.2 g/dL (ref 30.0–36.0)
MCV: 81.1 fl (ref 78.0–100.0)
Monocytes Absolute: 0.4 10*3/uL (ref 0.1–1.0)
Monocytes Relative: 10.7 % (ref 3.0–12.0)
Neutro Abs: 2.2 10*3/uL (ref 1.4–7.7)
Neutrophils Relative %: 52.7 % (ref 43.0–77.0)
Platelets: 214 10*3/uL (ref 150.0–400.0)
RBC: 5.55 Mil/uL (ref 4.22–5.81)
RDW: 13.9 % (ref 11.5–15.5)
WBC: 4.1 10*3/uL (ref 4.0–10.5)

## 2022-01-10 LAB — COMPREHENSIVE METABOLIC PANEL
ALT: 37 U/L (ref 0–53)
AST: 29 U/L (ref 0–37)
Albumin: 4.5 g/dL (ref 3.5–5.2)
Alkaline Phosphatase: 64 U/L (ref 39–117)
BUN: 13 mg/dL (ref 6–23)
CO2: 31 mEq/L (ref 19–32)
Calcium: 10.6 mg/dL — ABNORMAL HIGH (ref 8.4–10.5)
Chloride: 101 mEq/L (ref 96–112)
Creatinine, Ser: 1.26 mg/dL (ref 0.40–1.50)
GFR: 57.09 mL/min — ABNORMAL LOW (ref >60.00)
Glucose, Bld: 69 mg/dL — ABNORMAL LOW (ref 70–99)
Potassium: 3.5 mEq/L (ref 3.5–5.1)
Sodium: 138 mEq/L (ref 135–145)
Total Bilirubin: 0.5 mg/dL (ref 0.2–1.2)
Total Protein: 8 g/dL (ref 6.0–8.3)

## 2022-01-10 LAB — POC URINALSYSI DIPSTICK (AUTOMATED)
Bilirubin, UA: NEGATIVE
Blood, UA: POSITIVE
Glucose, UA: NEGATIVE
Ketones, UA: NEGATIVE
Leukocytes, UA: NEGATIVE
Nitrite, UA: NEGATIVE
Protein, UA: POSITIVE — AB
Spec Grav, UA: >=1.03 — AB (ref 1.010–1.025)
Urobilinogen, UA: 0.2 E.U./dL
pH, UA: 5 (ref 5.0–8.0)

## 2022-01-10 LAB — LIPID PANEL
Cholesterol: 116 mg/dL (ref 0–200)
HDL: 34.1 mg/dL — ABNORMAL LOW (ref >39.00)
LDL Cholesterol: 61 mg/dL (ref 0–99)
NonHDL: 82.08
Total CHOL/HDL Ratio: 3
Triglycerides: 107 mg/dL (ref 0.0–149.0)
VLDL: 21.4 mg/dL (ref 0.0–40.0)

## 2022-01-10 NOTE — Progress Notes (Addendum)
Phone [phone] In person visit   Subjective:   David Gilmore is a 71 y.o. year old very pleasant male patient who presents for/with See problem oriented charting Chief Complaint  Patient presents with   Follow-up   Hypertension    Past Medical History-  Patient Active Problem List   Diagnosis Date Noted   PAD (peripheral artery disease) ([place]) 09/19/2018    Priority: High   Mallory-Weiss tear     Priority: High   History of heart failure     Priority: High   Anemia 08/23/2017    Priority: High   Aortic atherosclerosis ([place]) 08/16/2017    Priority: Medium    BPH associated with nocturia 08/16/2017    Priority: Medium    Essential hypertension 06/22/2015    Priority: Medium    Hyperlipidemia     Priority: Medium    Shortness of breath 08/16/2017    Priority: Low   Abnormal stress test 07/27/2015    Priority: Low   Former smoker 06/15/2015    Priority: Low    Medications- reviewed and updated Current Outpatient Medications  Medication Sig Dispense Refill   amLODipine (NORVASC) 10 MG tablet TAKE 1 TABLET(10 MG) BY MOUTH DAILY 90 tablet 3   aspirin 81 MG tablet Take 81 mg by mouth daily.     cholecalciferol (VITAMIN D3) 25 MCG (1000 UT) tablet Take 1,000 Units by mouth daily.     Ferrous Sulfate (IRON PO) Take by mouth. Twice a week     hydrochlorothiazide (HYDRODIURIL) 25 MG tablet TAKE 1 TABLET(25 MG) BY MOUTH DAILY 90 tablet 2   rosuvastatin (CRESTOR) 40 MG tablet TAKE 1 TABLET(40 MG) BY MOUTH DAILY 90 tablet 3   sildenafil (REVATIO) 20 MG tablet TAKE 2 TO 5 TABLETS BY MOUTH EVERY 48 HOURS AS NEEDED FOR ERECTILE DYSFUNCTION 50 tablet 0   No current facility-administered medications for this visit.     Objective:  BP 118/70   Pulse 90   Temp 98.4 F (36.9 C)   Ht '5\' 11"'$  (1.803 m)   Wt 194 lb 6.4 oz (88.2 kg)   SpO2 95%   BMI 27.11 kg/m  Gen: NAD, resting comfortably TM normal bilaterally after cerumen removal with curette CV: RRR no murmurs  rubs or gallops Lungs: CTAB no crackles, wheeze, rhonchi Abdomen: soft/nontender/nondistended/normal bowel sounds. No rebound or guarding.  Ext: no edema Skin: warm, dry    Assessment and Plan   #hyperlipidemia #aortic atherosclerosis- incidental finding but LDL goal under 70 #Peripheral arterial disease with LDL goal under 70.  Noted on arterial study 07/02/2018-"Left: Resting left ankle-brachial index indicates moderate left lower extremity arterial disease. The left toe-brachial index is abnormal. CW waveforms suggest tibial disease." S: Medication: Rosuvastatin '40MG'$ , aspirin 81 mg -Continues to deny claudication at this time. Occasional pain at rest but improves with movement  Lab Results  Component Value Date   CHOL 116 01/10/2022   HDL 34.10 (L) 01/10/2022   LDLCALC 61 01/10/2022   LDLDIRECT 66.0 12/20/2018   TRIG 107.0 01/10/2022   CHOLHDL 3 01/10/2022   A/P: Lipids previously well controlled with LDL under 70-update full lipid panel today -For aortic atherosclerosis presumed stable-continue risk factor modification.  For peripheral arterial disease-consider repeating studies or referral back to Dr. Einar Gip if recurrent symptoms.  Continue aspirin as well  #hypertension # history of heart failure= EF normalized with better blood pressure control S: medication: amlodipine '10Mg'$ , HCTZ '25Mg'$  Home readings #s: does not check regularly  BP Readings  from Last 3 Encounters:  01/10/22 118/70  07/12/21 124/80  07/08/21 114/63   A/P:  Controlled. Continue current medications.    # anemia- ferritin has been low normal- twice a week iron recommended- now down to once a week Lab Results  Component Value Date   WBC 4.1 01/10/2022   HGB 14.9 01/10/2022   HCT 45.0 01/10/2022   MCV 81.1 01/10/2022   PLT 214.0 01/10/2022  -Up-to-date on colonoscopy as of July 08, 2021 with plan for 1 year repeat for adenomatous colon polyp  Recommended follow up: Return in about 6 months (around  07/13/2022) for followup or sooner if needed.Schedule b4 you leave. Future Appointments  Date Time Provider [place]  07/14/2022  9:00 AM [name], MD [facility]    Lab/Order associations:   ICD-10-CM   1. PAD (peripheral artery disease) (HCC)  I73.9     2. Essential hypertension  I10 POCT Urinalysis Dipstick (Automated)    POCT Urinalysis Dipstick (Automated)    3. Hyperlipidemia, unspecified hyperlipidemia type  E78.5 CBC with Differential/Platelet    Comprehensive metabolic panel    Lipid panel    POCT Urinalysis Dipstick (Automated)    POCT Urinalysis Dipstick (Automated)    4. Iron deficiency anemia, unspecified iron deficiency anemia type  D50.9     5. Aortic atherosclerosis (HCC)  I70.0       No orders of the defined types were placed in this encounter.   Return precautions advised.  [name], MD

## 2022-01-10 NOTE — Patient Instructions (Addendum)
Please stop by lab before you go If you have mychart- we will send your results within 3 business days of [place] receiving them.  If you do not have mychart- we will call you about results within 5 business days of [place] receiving them.  *please also note that you will see labs on mychart as soon as they post. I will later go in and write notes on them- will say "notes from Dr. Yong Channel"   Please check with your pharmacy to see if they have the shingrix vaccine. If they do- please get this immunization and update [place] by phone call or mychart with dates you receive the vaccine  Also due for tetanus if you get a cut/scrape in particular  Recommended follow up: Return in about 6 months (around 07/13/2022) for followup or sooner if needed.Schedule b4 you leave.

## 2022-01-26 ENCOUNTER — Other Ambulatory Visit (INDEPENDENT_AMBULATORY_CARE_PROVIDER_SITE_OTHER): Payer: Medicare Other

## 2022-01-26 DIAGNOSIS — R7989 Other specified abnormal findings of blood chemistry: Secondary | ICD-10-CM

## 2022-01-26 DIAGNOSIS — R809 Proteinuria, unspecified: Secondary | ICD-10-CM

## 2022-01-26 LAB — BASIC METABOLIC PANEL
BUN: 18 mg/dL (ref 6–23)
CO2: 33 mEq/L — ABNORMAL HIGH (ref 19–32)
Calcium: 10.6 mg/dL — ABNORMAL HIGH (ref 8.4–10.5)
Chloride: 99 mEq/L (ref 96–112)
Creatinine, Ser: 1.18 mg/dL (ref 0.40–1.50)
GFR: 61.75 mL/min (ref >60.00)
Glucose, Bld: 87 mg/dL (ref 70–99)
Potassium: 3.6 mEq/L (ref 3.5–5.1)
Sodium: 136 mEq/L (ref 135–145)

## 2022-01-26 LAB — MICROALBUMIN / CREATININE URINE RATIO
Creatinine,U: 86.4 mg/dL
Microalb Creat Ratio: 8.3 mg/g (ref 0.0–30.0)
Microalb, Ur: 7.2 mg/dL — ABNORMAL HIGH (ref 0.0–1.9)

## 2022-01-27 LAB — PARATHYROID HORMONE, INTACT (NO CA): PTH: 47 pg/mL (ref 16–77)

## 2022-03-24 ENCOUNTER — Ambulatory Visit (INDEPENDENT_AMBULATORY_CARE_PROVIDER_SITE_OTHER): Payer: Medicare Other

## 2022-03-24 DIAGNOSIS — Z0000 Encounter for general adult medical examination without abnormal findings: Secondary | ICD-10-CM
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
## 2022-03-24 NOTE — Patient Instructions (Signed)
David Gilmore , Thank you for taking time to come for your Medicare Wellness Visit. I appreciate your ongoing commitment to your health goals. Please review the following plan we discussed and let me know if I can assist you in the future.   These are the goals we discussed:  Goals      Patient Stated     Stay healthy      Patient Stated     Maintain health         This is a list of the screening recommended for you and due dates:  Health Maintenance  Topic Date Due   COVID-19 Vaccine (6 - Pfizer series) 09/17/2021   Flu Shot  02/01/2022   Zoster (Shingles) Vaccine (1 of 2) 04/12/2022*   Tetanus Vaccine  01/11/2023*   Colon Cancer Screening  07/08/2022   Pneumonia Vaccine  Completed   Hepatitis C Screening: USPSTF Recommendation to screen - Ages 18-79 yo.  Completed   HPV Vaccine  Aged Out  *Topic was postponed. The date shown is not the original due date.    Advanced directives: Advance directive discussed with you today. Even though you declined this today please call our office should you change your mind and we can give you the proper paperwork for you to fill out.  Conditions/risks identified: maintain health  Next appointment: Follow up in one year for your annual wellness visit.   Preventive Care 55 Years and Older, Male  Preventive care refers to lifestyle choices and visits with your health care provider that can promote health and wellness. What does preventive care include? A yearly physical exam. This is also called an annual well check. Dental exams once or twice a year. Routine eye exams. Ask your health care provider how often you should have your eyes checked. Personal lifestyle choices, including: Daily care of your teeth and gums. Regular physical activity. Eating a healthy diet. Avoiding tobacco and drug use. Limiting alcohol use. Practicing safe sex. Taking low doses of aspirin every day. Taking vitamin and mineral supplements as recommended by your  health care provider. What happens during an annual well check? The services and screenings done by your health care provider during your annual well check will depend on your age, overall health, lifestyle risk factors, and family history of disease. Counseling  Your health care provider may ask you questions about your: Alcohol use. Tobacco use. Drug use. Emotional well-being. Home and relationship well-being. Sexual activity. Eating habits. History of falls. Memory and ability to understand (cognition). Work and work Statistician. Screening  You may have the following tests or measurements: Height, weight, and BMI. Blood pressure. Lipid and cholesterol levels. These may be checked every 5 years, or more frequently if you are over 84 years old. Skin check. Lung cancer screening. You may have this screening every year starting at age 29 if you have a 30-pack-year history of smoking and currently smoke or have quit within the past 15 years. Fecal occult blood test (FOBT) of the stool. You may have this test every year starting at age 19. Flexible sigmoidoscopy or colonoscopy. You may have a sigmoidoscopy every 5 years or a colonoscopy every 10 years starting at age 65. Prostate cancer screening. Recommendations will vary depending on your family history and other risks. Hepatitis C blood test. Hepatitis B blood test. Sexually transmitted disease (STD) testing. Diabetes screening. This is done by checking your blood sugar (glucose) after you have not eaten for a while (fasting). You may  have this done every 1-3 years. Abdominal aortic aneurysm (AAA) screening. You may need this if you are a current or former smoker. Osteoporosis. You may be screened starting at age 38 if you are at high risk. Talk with your health care provider about your test results, treatment options, and if necessary, the need for more tests. Vaccines  Your health care provider may recommend certain vaccines, such  as: Influenza vaccine. This is recommended every year. Tetanus, diphtheria, and acellular pertussis (Tdap, Td) vaccine. You may need a Td booster every 10 years. Zoster vaccine. You may need this after age 86. Pneumococcal 13-valent conjugate (PCV13) vaccine. One dose is recommended after age 60. Pneumococcal polysaccharide (PPSV23) vaccine. One dose is recommended after age 91. Talk to your health care provider about which screenings and vaccines you need and how often you need them. This information is not intended to replace advice given to you by your health care provider. Make sure you discuss any questions you have with your health care provider. Document Released: 07/17/2015 Document Revised: 03/09/2016 Document Reviewed: 04/21/2015 Elsevier Interactive Patient Education  2017 [place] Prevention in the Home Falls can cause injuries. They can happen to people of all ages. There are many things you can do to make your home safe and to help prevent falls. What can I do on the outside of my home? Regularly fix the edges of walkways and driveways and fix any cracks. Remove anything that might make you trip as you walk through a door, such as a raised step or threshold. Trim any bushes or trees on the path to your home. Use bright outdoor lighting. Clear any walking paths of anything that might make someone trip, such as rocks or tools. Regularly check to see if handrails are loose or broken. Make sure that both sides of any steps have handrails. Any raised decks and porches should have guardrails on the edges. Have any leaves, snow, or ice cleared regularly. Use sand or salt on walking paths during winter. Clean up any spills in your garage right away. This includes oil or grease spills. What can I do in the bathroom? Use night lights. Install grab bars by the toilet and in the tub and shower. Do not use towel bars as grab bars. Use non-skid mats or decals in the tub or  shower. If you need to sit down in the shower, use a plastic, non-slip stool. Keep the floor dry. Clean up any water that spills on the floor as soon as it happens. Remove soap buildup in the tub or shower regularly. Attach bath mats securely with double-sided non-slip rug tape. Do not have throw rugs and other things on the floor that can make you trip. What can I do in the bedroom? Use night lights. Make sure that you have a light by your bed that is easy to reach. Do not use any sheets or blankets that are too big for your bed. They should not hang down onto the floor. Have a firm chair that has side arms. You can use this for support while you get dressed. Do not have throw rugs and other things on the floor that can make you trip. What can I do in the kitchen? Clean up any spills right away. Avoid walking on wet floors. Keep items that you use a lot in easy-to-reach places. If you need to reach something above you, use a strong step stool that has a grab bar. Keep electrical cords  out of the way. Do not use floor polish or wax that makes floors slippery. If you must use wax, use non-skid floor wax. Do not have throw rugs and other things on the floor that can make you trip. What can I do with my stairs? Do not leave any items on the stairs. Make sure that there are handrails on both sides of the stairs and use them. Fix handrails that are broken or loose. Make sure that handrails are as long as the stairways. Check any carpeting to make sure that it is firmly attached to the stairs. Fix any carpet that is loose or worn. Avoid having throw rugs at the top or bottom of the stairs. If you do have throw rugs, attach them to the floor with carpet tape. Make sure that you have a light switch at the top of the stairs and the bottom of the stairs. If you do not have them, ask someone to add them for you. What else can I do to help prevent falls? Wear shoes that: Do not have high heels. Have  rubber bottoms. Are comfortable and fit you well. Are closed at the toe. Do not wear sandals. If you use a stepladder: Make sure that it is fully opened. Do not climb a closed stepladder. Make sure that both sides of the stepladder are locked into place. Ask someone to hold it for you, if possible. Clearly mark and make sure that you can see: Any grab bars or handrails. First and last steps. Where the edge of each step is. Use tools that help you move around (mobility aids) if they are needed. These include: Canes. Walkers. Scooters. Crutches. Turn on the lights when you go into a dark area. Replace any light bulbs as soon as they burn out. Set up your furniture so you have a clear path. Avoid moving your furniture around. If any of your floors are uneven, fix them. If there are any pets around you, be aware of where they are. Review your medicines with your doctor. Some medicines can make you feel dizzy. This can increase your chance of falling. Ask your doctor what other things that you can do to help prevent falls. This information is not intended to replace advice given to you by your health care provider. Make sure you discuss any questions you have with your health care provider. Document Released: 04/16/2009 Document Revised: 11/26/2015 Document Reviewed: 07/25/2014 Elsevier Interactive Patient Education  2017 [employer].

## 2022-06-07 ENCOUNTER — Telehealth: Payer: Self-pay

## 2022-06-07 MED ORDER — ATORVASTATIN CALCIUM 80 MG PO TABS: 80.0000 mg | ORAL_TABLET | Freq: Every day | ORAL | 3 refills | Status: DC

## 2022-06-07 NOTE — Telephone Encounter (Signed)
[facility] pharmacy sent message stating Rosuvastatin 40 MG is not covered, but the preferred alternative Atorvastatin is. Im sending Atorvastatin 80 mg, #90 w/ 3 refills to pharmacy per Dr.Hunter request. I will also be contacting patient to inform them.

## 2022-06-07 NOTE — Telephone Encounter (Signed)
Pt called and stated that he just got a 90 day supply of the Rosuvastatin, I stated to him to continue to take, and once out he will transfer over to take the Atorvastatin because [facility] stated that his plan no longer covers the Rosuvastatin. I just spoke to [facility], and was told that [facility] doesn't cover Rosuvaststin but his Medicare is. So he will stay on Rosuvastatin, and [facility] discontinued the Atorvastatin. Patient will be notified.

## 2022-07-14 ENCOUNTER — Encounter: Payer: Self-pay | Admitting: Family Medicine

## 2022-07-14 ENCOUNTER — Ambulatory Visit (INDEPENDENT_AMBULATORY_CARE_PROVIDER_SITE_OTHER): Payer: Medicare Other | Admitting: Family Medicine

## 2022-07-14 VITALS — BP 122/70 | HR 102 | Temp 97.3°F | Ht 71.0 in | Wt 193.2 lb

## 2022-07-14 DIAGNOSIS — E785 Hyperlipidemia, unspecified: Secondary | ICD-10-CM

## 2022-07-14 DIAGNOSIS — I700 Atherosclerosis of aorta: Secondary | ICD-10-CM | POA: Diagnosis not present

## 2022-07-14 DIAGNOSIS — D509 Iron deficiency anemia, unspecified: Secondary | ICD-10-CM

## 2022-07-14 DIAGNOSIS — Z1211 Encounter for screening for malignant neoplasm of colon: Secondary | ICD-10-CM | POA: Diagnosis not present

## 2022-07-14 DIAGNOSIS — I10 Essential (primary) hypertension: Secondary | ICD-10-CM

## 2022-07-14 LAB — CBC WITH DIFFERENTIAL/PLATELET
Basophils Absolute: 0 10*3/uL (ref 0.0–0.1)
Basophils Relative: 0.9 % (ref 0.0–3.0)
Eosinophils Absolute: 0.1 10*3/uL (ref 0.0–0.7)
Eosinophils Relative: 1.8 % (ref 0.0–5.0)
HCT: 44.7 % (ref 39.0–52.0)
Hemoglobin: 14.9 g/dL (ref 13.0–17.0)
Lymphocytes Relative: 29.8 % (ref 12.0–46.0)
Lymphs Abs: 1.4 10*3/uL (ref 0.7–4.0)
MCHC: 33.4 g/dL (ref 30.0–36.0)
MCV: 81.3 fl (ref 78.0–100.0)
Monocytes Absolute: 0.5 10*3/uL (ref 0.1–1.0)
Monocytes Relative: 10.6 % (ref 3.0–12.0)
Neutro Abs: 2.7 10*3/uL (ref 1.4–7.7)
Neutrophils Relative %: 56.9 % (ref 43.0–77.0)
Platelets: 251 10*3/uL (ref 150.0–400.0)
RBC: 5.5 Mil/uL (ref 4.22–5.81)
RDW: 14.3 % (ref 11.5–15.5)
WBC: 4.8 10*3/uL (ref 4.0–10.5)

## 2022-07-14 LAB — COMPREHENSIVE METABOLIC PANEL
ALT: 26 U/L (ref 0–53)
AST: 23 U/L (ref 0–37)
Albumin: 4.4 g/dL (ref 3.5–5.2)
Alkaline Phosphatase: 53 U/L (ref 39–117)
BUN: 21 mg/dL (ref 6–23)
CO2: 30 mEq/L (ref 19–32)
Calcium: 10.6 mg/dL — ABNORMAL HIGH (ref 8.4–10.5)
Chloride: 102 mEq/L (ref 96–112)
Creatinine, Ser: 1.17 mg/dL (ref 0.40–1.50)
GFR: 62.18 mL/min (ref >60.00)
Glucose, Bld: 82 mg/dL (ref 70–99)
Potassium: 3.9 mEq/L (ref 3.5–5.1)
Sodium: 141 mEq/L (ref 135–145)
Total Bilirubin: 0.6 mg/dL (ref 0.2–1.2)
Total Protein: 7.5 g/dL (ref 6.0–8.3)

## 2022-07-14 LAB — IBC + FERRITIN
Ferritin: 28.6 ng/mL (ref 22.0–322.0)
Iron: 87 ug/dL (ref 42–165)
Saturation Ratios: 18.1 % — ABNORMAL LOW (ref 20.0–50.0)
TIBC: 480.2 ug/dL — ABNORMAL HIGH (ref 250.0–450.0)
Transferrin: 343 mg/dL (ref 212.0–360.0)

## 2022-07-14 NOTE — Progress Notes (Signed)
Phone [phone] In person visit   Subjective:   David Gilmore is a 72 y.o. year old very pleasant male patient who presents for/with See problem oriented charting Chief Complaint  Patient presents with   Follow-up   Hypertension   Past Medical History-  Patient Active Problem List   Diagnosis Date Noted   PAD (peripheral artery disease) ([place]) 09/19/2018    Priority: High   Mallory-Weiss tear     Priority: High   History of heart failure     Priority: High   Anemia 08/23/2017    Priority: High   Aortic atherosclerosis ([place]) 08/16/2017    Priority: Medium    BPH associated with nocturia 08/16/2017    Priority: Medium    Essential hypertension 06/22/2015    Priority: Medium    Hyperlipidemia     Priority: Medium    Shortness of breath 08/16/2017    Priority: Low   Abnormal stress test 07/27/2015    Priority: Low   Former smoker 06/15/2015    Priority: Low    Medications- reviewed and updated Current Outpatient Medications  Medication Sig Dispense Refill   amLODipine (NORVASC) 10 MG tablet TAKE 1 TABLET(10 MG) BY MOUTH DAILY 90 tablet 3   aspirin 81 MG tablet Take 81 mg by mouth daily.     cholecalciferol (VITAMIN D3) 25 MCG (1000 UT) tablet Take 1,000 Units by mouth daily.     Ferrous Sulfate (IRON PO) Take by mouth. Twice a week     hydrochlorothiazide (HYDRODIURIL) 25 MG tablet TAKE 1 TABLET(25 MG) BY MOUTH DAILY 90 tablet 2   rosuvastatin (CRESTOR) 40 MG tablet TAKE 1 TABLET(40 MG) BY MOUTH DAILY 90 tablet 3   sildenafil (REVATIO) 20 MG tablet TAKE 2 TO 5 TABLETS BY MOUTH EVERY 48 HOURS AS NEEDED FOR ERECTILE DYSFUNCTION 50 tablet 0   No current facility-administered medications for this visit.     Objective:  BP 122/70   Pulse (!) 102   Temp (!) 97.3 F (36.3 C)   Ht '5\' 11"'$  (1.803 m)   Wt 193 lb 3.2 oz (87.6 kg)   SpO2 97%   BMI 26.95 kg/m  Gen: NAD, resting comfortably CV: RRR no murmurs rubs or gallops Lungs: CTAB no crackles, wheeze,  rhonchi  Ext: no edema Skin: warm, dry     Assessment and Plan   #Immunizations- plans to get covid shot and RSV  #hyperlipidemia #aortic atherosclerosis- incidental finding but LDL goal under 70 #Peripheral arterial disease with LDL goal under 70.  Noted on arterial study 07/02/2018-"Left: Resting left ankle-brachial index indicates moderate left lower extremity arterial disease. The left toe-brachial index is abnormal. CW waveforms suggest tibial disease." S: Medication: rosuvastatin 40 MG, aspirin 81 mg Denies claudication still. No chest pain or shortness of breath  Lab Results  Component Value Date   CHOL 116 01/10/2022   HDL 34.10 (L) 01/10/2022   LDLCALC 61 01/10/2022   LDLDIRECT 66.0 12/20/2018   TRIG 107.0 01/10/2022   CHOLHDL 3 01/10/2022   A/P: Lipids at goal- continue current medications Aortic atherosclerosis (presume stable)- continue statin PAD noted but asymptomatic- continue current medications   #hypertension # history of heart failure-EF normalized with better blood pressure control- doesn't see cardiology anymore as result S: medication: amlodipine '10Mg'$ , HCTZ '25Mg'$  BP Readings from Last 3 Encounters:  07/14/22 122/70  01/10/22 118/70  07/12/21 124/80  A/P: stable- continue current medicines  - with hypercalcemia- considering reducing hydrochlorothiazide if this worsens  # anemia- ferritin  has been low normal in the past- twice a week iron recommended initially- down to once a week in 2023- will update ferritin levels  Recommended follow up: Return in about 6 months (around 01/12/2023) for followup or sooner if needed.Schedule b4 you leave. Future Appointments  Date Time Provider [place]  03/30/2023  9:30 AM [facility]   Lab/Order associations:   ICD-10-CM   1. Essential hypertension  I10     2. Hyperlipidemia, unspecified hyperlipidemia type  E78.5     3. Aortic atherosclerosis (HCC)  I70.0     4. Iron deficiency  anemia, unspecified iron deficiency anemia type  D50.9     5. Screen for colon cancer  Z12.11       No orders of the defined types were placed in this encounter.   Return precautions advised.  [name], MD

## 2022-07-14 NOTE — Patient Instructions (Addendum)
Tdap at pharmacy Also covid and RSV at pharmacy recommended  [facility] contact- call them if you dont hear within a month Please call to schedule visit and/or procedure Address: [address], [place], [place] [address] Phone: [phone]   Please stop by lab before you go If you have mychart- we will send your results within 3 business days of [place] receiving them.  If you do not have mychart- we will call you about results within 5 business days of [place] receiving them.  *please also note that you will see labs on mychart as soon as they post. I will later go in and write notes on them- will say "notes from Dr. Yong Channel"   Recommended follow up: Return in about 6 months (around 01/12/2023) for followup or sooner if needed.Schedule b4 you leave.

## 2022-07-20 ENCOUNTER — Encounter: Payer: Self-pay | Admitting: Gastroenterology

## 2022-07-22 ENCOUNTER — Other Ambulatory Visit (HOSPITAL_BASED_OUTPATIENT_CLINIC_OR_DEPARTMENT_OTHER): Payer: Self-pay

## 2022-07-22 ENCOUNTER — Other Ambulatory Visit (HOSPITAL_COMMUNITY): Payer: Self-pay

## 2022-07-22 DIAGNOSIS — Z23 Encounter for immunization: Secondary | ICD-10-CM | POA: Diagnosis not present

## 2022-07-22 MED ORDER — COMIRNATY 30 MCG/0.3ML IM SUSY
0.3000 mL | PREFILLED_SYRINGE | Freq: Once | INTRAMUSCULAR | 0 refills | Status: AC
  Filled 2022-07-22 (×3): qty 0.3, 1d supply, fill #0

## 2022-07-27 ENCOUNTER — Other Ambulatory Visit (HOSPITAL_BASED_OUTPATIENT_CLINIC_OR_DEPARTMENT_OTHER): Payer: Self-pay

## 2022-08-01 ENCOUNTER — Other Ambulatory Visit (HOSPITAL_BASED_OUTPATIENT_CLINIC_OR_DEPARTMENT_OTHER): Payer: Self-pay

## 2022-08-14 ENCOUNTER — Other Ambulatory Visit: Payer: Self-pay | Admitting: Family Medicine

## 2022-08-25 ENCOUNTER — Encounter: Payer: Self-pay | Admitting: Gastroenterology

## 2022-10-03 ENCOUNTER — Ambulatory Visit (AMBULATORY_SURGERY_CENTER): Payer: Medicare Other | Admitting: *Deleted

## 2022-10-03 VITALS — Ht 71.0 in | Wt 195.0 lb

## 2022-10-03 DIAGNOSIS — Z86010 Personal history of colonic polyps: Secondary | ICD-10-CM

## 2022-10-03 MED ORDER — PEG 3350-KCL-NA BICARB-NACL 420 G PO SOLR: 4000.0000 mL | Freq: Once | ORAL | 0 refills | Status: AC

## 2022-10-03 NOTE — Progress Notes (Signed)
No egg or soy allergy known to patient  No issues known to pt with past sedation with any surgeries or procedures Patient denies ever being told they had issues or difficulty with intubation  No FH of Malignant Hyperthermia Pt is not on diet pills Pt is not on  home 02  Pt is not on blood thinners  Pt denies issues with constipation  No A fib or A flutter Have any cardiac testing pending--no Pt instructed to use [email or web address] or GoodRx for a price reduction on prep    earachePatient's chart reviewed by David Gilmore CNRA prior to previsit and patient appropriate for the [facility].  Previsit completed and red dot placed by patient's name on their procedure day (on provider's schedule).

## 2022-10-25 ENCOUNTER — Ambulatory Visit (AMBULATORY_SURGERY_CENTER): Payer: Medicare Other | Admitting: Gastroenterology

## 2022-10-25 ENCOUNTER — Encounter: Payer: Self-pay | Admitting: Gastroenterology

## 2022-10-25 VITALS — BP 96/59 | HR 75 | Temp 98.9°F | Resp 19 | Ht 71.0 in | Wt 195.0 lb

## 2022-10-25 DIAGNOSIS — D123 Benign neoplasm of transverse colon: Secondary | ICD-10-CM

## 2022-10-25 DIAGNOSIS — E669 Obesity, unspecified: Secondary | ICD-10-CM | POA: Diagnosis not present

## 2022-10-25 DIAGNOSIS — Z86010 Personal history of colonic polyps: Secondary | ICD-10-CM

## 2022-10-25 DIAGNOSIS — Z09 Encounter for follow-up examination after completed treatment for conditions other than malignant neoplasm: Secondary | ICD-10-CM

## 2022-10-25 DIAGNOSIS — I10 Essential (primary) hypertension: Secondary | ICD-10-CM | POA: Diagnosis not present

## 2022-10-25 MED ORDER — SODIUM CHLORIDE 0.9 % IV SOLN: 500.0000 mL | INTRAVENOUS | Status: DC

## 2022-10-25 NOTE — Progress Notes (Signed)
Pt's states no medical or surgical changes since previsit or office visit. 
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
## 2022-10-25 NOTE — Patient Instructions (Signed)
Handout provided about polyps and diverticulosis.  Resume previous diet.  Continue present medications.  Await pathology results.  Repeat colonoscopy is recommended for surveillance.  The colonoscopy date will be determined after pathology results from today's exam become available for review.    YOU HAD AN ENDOSCOPIC PROCEDURE TODAY AT THE [facility]:   Refer to the procedure report that was given to you for any specific questions about what was found during the examination.  If the procedure report does not answer your questions, please call your gastroenterologist to clarify.  If you requested that your care partner not be given the details of your procedure findings, then the procedure report has been included in a sealed envelope for you to review at your convenience later.  YOU SHOULD EXPECT: Some feelings of bloating in the abdomen. Passage of more gas than usual.  Walking can help get rid of the air that was put into your GI tract during the procedure and reduce the bloating. If you had a lower endoscopy (such as a colonoscopy or flexible sigmoidoscopy) you may notice spotting of blood in your stool or on the toilet paper. If you underwent a bowel prep for your procedure, you may not have a normal bowel movement for a few days.  Please Note:  You might notice some irritation and congestion in your nose or some drainage.  This is from the oxygen used during your procedure.  There is no need for concern and it should clear up in a day or so.  SYMPTOMS TO REPORT IMMEDIATELY:  Following lower endoscopy (colonoscopy or flexible sigmoidoscopy):  Excessive amounts of blood in the stool  Significant tenderness or worsening of abdominal pains  Swelling of the abdomen that is new, acute  Fever of 100F or higher  For urgent or emergent issues, a gastroenterologist can be reached at any hour by calling [phone]. Do not use MyChart messaging for urgent concerns.    DIET:  We do  recommend a small meal at first, but then you may proceed to your regular diet.  Drink plenty of fluids but you should avoid alcoholic beverages for 24 hours.  ACTIVITY:  You should plan to take it easy for the rest of today and you should NOT DRIVE or use heavy machinery until tomorrow (because of the sedation medicines used during the test).    FOLLOW UP: Our staff will call the number listed on your records the next business day following your procedure.  We will call around 7:15- 8:00 am to check on you and address any questions or concerns that you may have regarding the information given to you following your procedure. If we do not reach you, we will leave a message.     If any biopsies were taken you will be contacted by phone or by letter within the next 1-3 weeks.  Please call us at [phone] if you have not heard about the biopsies in 3 weeks.    SIGNATURES/CONFIDENTIALITY: You and/or your care partner have signed paperwork which will be entered into your electronic medical record.  These signatures attest to the fact that that the information above on your After Visit Summary has been reviewed and is understood.  Full responsibility of the confidentiality of this discharge information lies with you and/or your care-partner.

## 2022-10-25 NOTE — Progress Notes (Signed)
Called to room to assist during endoscopic procedure.  Patient ID and intended procedure confirmed with present staff. Received instructions for my participation in the procedure from the performing physician.  

## 2022-10-25 NOTE — Op Note (Signed)
[facility] Patient Name: David Gilmore Procedure Date: 10/25/2022 10:37 AM MRN: [id number] Endoscopist: Sherilyn Cooter L. Myrtie Neither , MD, [id number] Age: 72 Referring MD:  Date of Birth: 03-01-1951 Gender: Male Account #: [id number] Procedure:                Colonoscopy Indications:              High risk colon cancer surveillance: Personal                            history of colonic polyps                           January 2023 -12 mm cecal tubular adenoma                           16 to 18 mm flat tubular adenoma removed piecemeal.                           8 mm splenic flexure tubular adenoma                           May 2017 - -subcentimeter tubular adenoma x 2 Medicines:                Monitored Anesthesia Care Procedure:                Pre-Anesthesia Assessment:                           - Prior to the procedure, a History and Physical                            was performed, and patient medications and                            allergies were reviewed. The patient's tolerance of                            previous anesthesia was also reviewed. The risks                            and benefits of the procedure and the sedation                            options and risks were discussed with the patient.                            All questions were answered, and informed consent                            was obtained. Prior Anticoagulants: The patient has                            taken no anticoagulant or antiplatelet agents. ASA  Grade Assessment: II - A patient with mild systemic                            disease. After reviewing the risks and benefits,                            the patient was deemed in satisfactory condition to                            undergo the procedure.                           After obtaining informed consent, the colonoscope                            was passed under direct vision. Throughout the                             procedure, the patient's blood pressure, pulse, and                            oxygen saturations were monitored continuously. The                            CF [id number] #[id number] was introduced through the anus                            and advanced to the the cecum, identified by                            appendiceal orifice and ileocecal valve. The                            colonoscopy was somewhat difficult due to a                            redundant colon. Successful completion of the                            procedure was aided by using manual pressure and                            straightening and shortening the scope to obtain                            bowel loop reduction. The patient tolerated the                            procedure well. The quality of the bowel                            preparation was good. The ileocecal valve,  appendiceal orifice, and rectum were photographed. Scope In: 10:51:33 AM Scope Out: 11:07:41 AM Scope Withdrawal Time: 0 hours 11 minutes 7 seconds  Total Procedure Duration: 0 hours 16 minutes 8 seconds  Findings:                 The perianal and digital rectal examinations were                            normal.                           Repeat examination of right colon under NBI                            performed.                           A tattoo was seen in the transverse colon.                           A 4 mm polypoid lesion was found in the transverse                            colon. The lesion was sessile and adjacent to a                            scar near the tattoo noted above (prior polypectomy                            site). The polyp was removed with a cold snare.                            Resection and retrieval were complete.                           Multiple diverticula were found in the left colon.                           The exam was otherwise without abnormality on                             direct and retroflexion views. Complications:            No immediate complications. Estimated Blood Loss:     Estimated blood loss was minimal. Impression:               - A tattoo was seen in the transverse colon.                           - Likely benign polypoid lesion in the transverse                            colon. Complete removal was accomplished.                           - Diverticulosis  in the left colon.                           - The examination was otherwise normal on direct                            and retroflexion views. Recommendation:           - Patient has a contact number available for                            emergencies. The signs and symptoms of potential                            delayed complications were discussed with the                            patient. Return to normal activities tomorrow.                            Written discharge instructions were provided to the                            patient.                           - Resume previous diet.                           - Continue present medications.                           - Await pathology results.                           - Repeat colonoscopy is recommended for                            surveillance. The colonoscopy date will be                            determined after pathology results from today's                            exam become available for review. Ellyn Rubiano L. Myrtie Neither, MD 10/25/2022 11:12:33 AM This report has been signed electronically.

## 2022-10-25 NOTE — Progress Notes (Signed)
Report to PACU, RN, vss, BBS= Clear.  

## 2022-10-26 ENCOUNTER — Telehealth: Payer: Self-pay | Admitting: *Deleted

## 2022-10-26 NOTE — Telephone Encounter (Signed)
Post procedure follow up call placed, no answer and left VM.  

## 2022-10-28 ENCOUNTER — Encounter: Payer: Self-pay | Admitting: Gastroenterology

## 2022-12-21 ENCOUNTER — Other Ambulatory Visit: Payer: Self-pay

## 2022-12-21 MED ORDER — AMLODIPINE BESYLATE 10 MG PO TABS: ORAL_TABLET | ORAL | 3 refills | Status: DC

## 2023-01-17 ENCOUNTER — Encounter: Payer: Self-pay | Admitting: Family Medicine

## 2023-01-17 ENCOUNTER — Ambulatory Visit: Payer: Medicare Other | Admitting: Family Medicine

## 2023-01-17 VITALS — BP 138/86 | HR 96 | Temp 97.7°F | Ht 71.0 in | Wt 188.0 lb

## 2023-01-17 DIAGNOSIS — E785 Hyperlipidemia, unspecified: Secondary | ICD-10-CM

## 2023-01-17 DIAGNOSIS — I10 Essential (primary) hypertension: Secondary | ICD-10-CM

## 2023-01-17 LAB — COMPREHENSIVE METABOLIC PANEL
ALT: 25 U/L (ref 0–53)
AST: 24 U/L (ref 0–37)
Albumin: 4.3 g/dL (ref 3.5–5.2)
Alkaline Phosphatase: 59 U/L (ref 39–117)
BUN: 14 mg/dL (ref 6–23)
CO2: 35 mEq/L — ABNORMAL HIGH (ref 19–32)
Calcium: 10.9 mg/dL — ABNORMAL HIGH (ref 8.4–10.5)
Chloride: 99 mEq/L (ref 96–112)
Creatinine, Ser: 1.16 mg/dL (ref 0.40–1.50)
GFR: 62.6 mL/min (ref >60.00)
Glucose, Bld: 93 mg/dL (ref 70–99)
Potassium: 3.5 mEq/L (ref 3.5–5.1)
Sodium: 138 mEq/L (ref 135–145)
Total Bilirubin: 0.7 mg/dL (ref 0.2–1.2)
Total Protein: 7.9 g/dL (ref 6.0–8.3)

## 2023-01-17 LAB — LIPID PANEL
Cholesterol: 126 mg/dL (ref 0–200)
HDL: 33.2 mg/dL — ABNORMAL LOW (ref >39.00)
LDL Cholesterol: 55 mg/dL (ref 0–99)
NonHDL: 92.35
Total CHOL/HDL Ratio: 4
Triglycerides: 187 mg/dL — ABNORMAL HIGH (ref 0.0–149.0)
VLDL: 37.4 mg/dL (ref 0.0–40.0)

## 2023-01-17 LAB — CBC WITH DIFFERENTIAL/PLATELET
Basophils Absolute: 0 10*3/uL (ref 0.0–0.1)
Basophils Relative: 0.6 % (ref 0.0–3.0)
Eosinophils Absolute: 0.1 10*3/uL (ref 0.0–0.7)
Eosinophils Relative: 1.7 % (ref 0.0–5.0)
HCT: 45.2 % (ref 39.0–52.0)
Hemoglobin: 15 g/dL (ref 13.0–17.0)
Lymphocytes Relative: 17.7 % (ref 12.0–46.0)
Lymphs Abs: 0.9 10*3/uL (ref 0.7–4.0)
MCHC: 33.2 g/dL (ref 30.0–36.0)
MCV: 82.3 fl (ref 78.0–100.0)
Monocytes Absolute: 0.5 10*3/uL (ref 0.1–1.0)
Monocytes Relative: 9.1 % (ref 3.0–12.0)
Neutro Abs: 3.7 10*3/uL (ref 1.4–7.7)
Neutrophils Relative %: 70.9 % (ref 43.0–77.0)
Platelets: 265 10*3/uL (ref 150.0–400.0)
RBC: 5.5 Mil/uL (ref 4.22–5.81)
RDW: 14.1 % (ref 11.5–15.5)
WBC: 5.2 10*3/uL (ref 4.0–10.5)

## 2023-01-17 MED ORDER — SILDENAFIL CITRATE 100 MG PO TABS: 100.0000 mg | ORAL_TABLET | Freq: Every day | ORAL | 3 refills | Status: DC | PRN

## 2023-01-17 NOTE — Progress Notes (Signed)
Phone [phone] In person visit   Subjective:   Ivy Meriwether is a 72 y.o. year old very pleasant male patient who presents for/with See problem oriented charting Chief Complaint  Patient presents with   Medical Management of Chronic Issues   Hypertension    Past Medical History-  Patient Active Problem List   Diagnosis Date Noted   PAD (peripheral artery disease) (HCC) 09/19/2018    Priority: High   Mallory-Weiss tear     Priority: High   History of heart failure     Priority: High   Anemia 08/23/2017    Priority: High   Aortic atherosclerosis (HCC) 08/16/2017    Priority: Medium    BPH associated with nocturia 08/16/2017    Priority: Medium    Essential hypertension 06/22/2015    Priority: Medium    Hyperlipidemia     Priority: Medium    Shortness of breath 08/16/2017    Priority: Low   Abnormal stress test 07/27/2015    Priority: Low   Former smoker 06/15/2015    Priority: Low    Medications- reviewed and updated Current Outpatient Medications  Medication Sig Dispense Refill   amLODipine (NORVASC) 10 MG tablet TAKE 1 TABLET(10 MG) BY MOUTH DAILY 90 tablet 3   aspirin 81 MG tablet Take 81 mg by mouth daily.     cholecalciferol (VITAMIN D3) 25 MCG (1000 UT) tablet Take 1,000 Units by mouth daily.     Ferrous Sulfate (IRON PO) Take by mouth. once a week     hydrochlorothiazide (HYDRODIURIL) 25 MG tablet TAKE 1 TABLET(25 MG) BY MOUTH DAILY 90 tablet 2   rosuvastatin (CRESTOR) 40 MG tablet TAKE 1 TABLET(40 MG) BY MOUTH DAILY 90 tablet 3   sildenafil (REVATIO) 20 MG tablet TAKE 2 TO 5 TABLETS BY MOUTH EVERY 48 HOURS AS NEEDED FOR ERECTILE DYSFUNCTION 50 tablet 0   No current facility-administered medications for this visit.     Objective:  BP 138/86   Pulse 96   Temp 97.7 F (36.5 C)   Ht 5\' 11"  (1.803 m)   Wt 188 lb (85.3 kg)   SpO2 99%   BMI 26.22 kg/m  Gen: NAD, resting comfortably CV: RRR no murmurs rubs or gallops Lungs: CTAB no crackles,  wheeze, rhonchi Ext: no edema Skin: warm, dry     Assessment and Plan   #social update-  lost Liborio Nixon his wife  11/22/22- still mourning  #hyperlipidemia #aortic atherosclerosis- incidental finding but LDL goal under 70 #Peripheral arterial disease with LDL goal under 70.  Noted on arterial study 07/02/2018-"Left: Resting left ankle-brachial index indicates moderate left lower extremity arterial disease. The left toe-brachial index is abnormal. CW waveforms suggest tibial disease." S: Medication: Rosuvastatin 40MG , aspirin 81 mg Denies claudication  still  Lab Results  Component Value Date   CHOL 116 01/10/2022   HDL 34.10 (L) 01/10/2022   LDLCALC 61 01/10/2022   LDLDIRECT 66.0 12/20/2018   TRIG 107.0 01/10/2022   CHOLHDL 3 01/10/2022  A/P: lipids have been at goal - update lipid panel- continue current medications Aortic atherosclerosis (presumed stable)- LDL goal ideally <70 - has been at goal- continue current medications Peripheral arterial disease asymptomatic/stable- continue current medications   #hypertension # history of heart failure= EF normalized with better blood pressure control S: medication: amlodipine 10Mg , HCTZ 25Mg  Home readings #s: no recent checks BP Readings from Last 3 Encounters:  01/17/23 138/86  10/25/22 (!) 96/59  07/14/22 122/70  A/P: blood pressure high acceptable but  has been much lower on prior checks- wondre if loss of wife and being in same office where she was cared for contributes- continue current medications   # anemia- ferritin has been low normal- twice a week iron recommended- down to once a week in 2023- still taking as of now- update just CBC today  #Ed - wants to trial Viagra 100 mg instead of prior sildenafil 20 mg- sent this in for him.   Recommended follow up: Return in about 6 months (around 07/20/2023) for followup or sooner if needed.Schedule b4 you leave. Future Appointments  Date Time Provider Department Center  03/30/2023   9:30 AM [facility] ANNUAL WELLNESS VISIT 1 [facility]    Lab/Order associations: NOT fasting    ICD-10-CM   1. Essential hypertension  I10 Comprehensive metabolic panel    CBC with Differential/Platelet    Lipid panel    2. Hyperlipidemia, unspecified hyperlipidemia type  E78.5 Comprehensive metabolic panel    CBC with Differential/Platelet    Lipid panel      Meds ordered this encounter  Medications   sildenafil (VIAGRA) 100 MG tablet    Sig: Take 1 tablet (100 mg total) by mouth daily as needed for erectile dysfunction.    Dispense:  30 tablet    Refill:  3    Plans to use goodrx    Return precautions advised.  Tana Conch, MD

## 2023-01-17 NOTE — Patient Instructions (Addendum)
Let us know if you get any vaccines at your pharmacy such as COVID or flu shot in fall   Please stop by lab before you go If you have mychart- we will send your results within 3 business days of [place] receiving them.  If you do not have mychart- we will call you about results within 5 business days of [place] receiving them.  *please also note that you will see labs on mychart as soon as they post. I will later go in and write notes on them- will say "notes from Dr. Durene Cal"   Recommended follow up: Return in about 6 months (around 07/20/2023) for followup or sooner if needed.Schedule b4 you leave.

## 2023-01-18 ENCOUNTER — Other Ambulatory Visit: Payer: Self-pay

## 2023-01-18 MED ORDER — VALSARTAN 160 MG PO TABS: 160.0000 mg | ORAL_TABLET | Freq: Every day | ORAL | 3 refills | Status: DC

## 2023-01-29 ENCOUNTER — Emergency Department (HOSPITAL_COMMUNITY): Payer: Medicare Other

## 2023-01-29 ENCOUNTER — Emergency Department (HOSPITAL_COMMUNITY)
Admission: EM | Admit: 2023-01-29 | Discharge: 2023-01-29 | Disposition: A | Discharge status: Home / Self Care | Payer: Medicare Other | Source: Home / Self Care | Attending: Emergency Medicine | Admitting: Emergency Medicine

## 2023-01-29 ENCOUNTER — Other Ambulatory Visit: Payer: Self-pay

## 2023-01-29 DIAGNOSIS — R2231 Localized swelling, mass and lump, right upper limb: Secondary | ICD-10-CM | POA: Diagnosis not present

## 2023-01-29 DIAGNOSIS — R2232 Localized swelling, mass and lump, left upper limb: Secondary | ICD-10-CM | POA: Insufficient documentation

## 2023-01-29 DIAGNOSIS — M79642 Pain in left hand: Secondary | ICD-10-CM | POA: Diagnosis present

## 2023-01-29 DIAGNOSIS — M7989 Other specified soft tissue disorders: Secondary | ICD-10-CM

## 2023-01-29 DIAGNOSIS — Z7982 Long term (current) use of aspirin: Secondary | ICD-10-CM | POA: Diagnosis not present

## 2023-01-29 LAB — HIV ANTIBODY (ROUTINE TESTING W REFLEX): HIV Screen 4th Generation wRfx: NONREACTIVE

## 2023-01-29 MED ORDER — KETOROLAC TROMETHAMINE 15 MG/ML IJ SOLN
15.0000 mg | Freq: Once | INTRAMUSCULAR | Status: AC
  Administered 2023-01-29: 15 mg via INTRAMUSCULAR
  Filled 2023-01-29: qty 1

## 2023-01-29 MED ORDER — DOXYCYCLINE HYCLATE 100 MG PO CAPS: 100.0000 mg | ORAL_CAPSULE | Freq: Two times a day (BID) | ORAL | 0 refills | Status: DC

## 2023-01-29 MED ORDER — COLCHICINE 0.6 MG PO TABS: ORAL_TABLET | ORAL | 0 refills | Status: DC

## 2023-01-29 MED ORDER — DOXYCYCLINE HYCLATE 100 MG PO TABS
100.0000 mg | ORAL_TABLET | Freq: Once | ORAL | Status: AC
  Administered 2023-01-29: 100 mg via ORAL
  Filled 2023-01-29: qty 1

## 2023-01-29 NOTE — ED Provider Notes (Signed)
[name] EMERGENCY DEPARTMENT AT [facility] Provider Note   CSN: [id number] Arrival date & time: 01/29/23  2130     History  Chief Complaint  Patient presents with   Joint Swelling    David Gilmore is a 72 y.o. male.  72 yo M with a chief complaints of bilateral hand pain.  This has been going on for about a week.  It started more with his left arm and he thought it was due to sleeping on the couch.  He then noticed that both of his hands been swelling off and on.  He thinks maybe it has to do with what he is eating.  Seems occur after he has stirfry.  Denies trauma to either hand.  He denies fevers or chills.  Had a recent PCP visit where he was told that everything looked okay.        Home Medications Prior to Admission medications   Medication Sig Start Date End Date Taking? Authorizing Provider  colchicine 0.6 MG tablet Take 2 pills by mouth  and then an hour later take the third pill by mouth. 01/29/23  Yes Melene Plan, DO  doxycycline (VIBRAMYCIN) 100 MG capsule Take 1 capsule (100 mg total) by mouth 2 (two) times daily. One po bid x 7 days 01/29/23  Yes Melene Plan, DO  valsartan (DIOVAN) 160 MG tablet Take 1 tablet (160 mg total) by mouth daily. 01/18/23   Shelva Majestic, MD  amLODipine (NORVASC) 10 MG tablet TAKE 1 TABLET(10 MG) BY MOUTH DAILY 12/21/22   Shelva Majestic, MD  aspirin 81 MG tablet Take 81 mg by mouth daily.    [provider]  cholecalciferol (VITAMIN D3) 25 MCG (1000 UT) tablet Take 1,000 Units by mouth daily.    [provider]  Ferrous Sulfate (IRON PO) Take by mouth. once a week    [provider]  rosuvastatin (CRESTOR) 40 MG tablet TAKE 1 TABLET(40 MG) BY MOUTH DAILY 08/15/22   Shelva Majestic, MD  sildenafil (VIAGRA) 100 MG tablet Take 1 tablet (100 mg total) by mouth daily as needed for erectile dysfunction. 01/17/23   Shelva Majestic, MD      Allergies    Penicillins    Review of Systems    Review of Systems  Physical Exam Updated Vital Signs BP (!) 161/99 (BP Location: Left Arm)   Pulse (!) 116   Temp 98.3 F (36.8 C) (Oral)   Resp 16   Ht 5\' 11"  (1.803 m)   Wt 85.3 kg   SpO2 99%   BMI 26.22 kg/m  Physical Exam Vitals and nursing note reviewed.  Constitutional:      Appearance: He is well-developed.  HENT:     Head: Normocephalic and atraumatic.  Eyes:     Pupils: Pupils are equal, round, and reactive to light.  Neck:     Vascular: No JVD.  Cardiovascular:     Rate and Rhythm: Normal rate and regular rhythm.     Heart sounds: No murmur heard.    No friction rub. No gallop.     Comments: No appreciable murmur Pulmonary:     Effort: No respiratory distress.     Breath sounds: No wheezing.  Abdominal:     General: There is no distension.     Tenderness: There is no abdominal tenderness. There is no guarding or rebound.  Musculoskeletal:        General: Tenderness present. Normal range of motion.  Cervical back: Normal range of motion and neck supple.     Comments: Bilateral hand swelling mostly about the MCP of the second and third digit on either hand.  Slightly worse on the right than the left.  He has some swelling to the fingers as well.  No obvious swelling to the wrists or elbows.  Skin:    Coloration: Skin is not pale.     Findings: No rash.  Neurological:     Mental Status: He is alert and oriented to person, place, and time.  Psychiatric:        Behavior: Behavior normal.     ED Results / Procedures / Treatments   Labs (all labs ordered are listed, but only abnormal results are displayed) Labs Reviewed  RPR  HIV ANTIBODY (ROUTINE TESTING W REFLEX)  GC/CHLAMYDIA PROBE AMP ([place]) NOT AT [facility]    EKG None  Radiology DG Hand 2 View Left  Result Date: 01/29/2023 CLINICAL DATA:  72 year old male with bilateral hand swelling. Shoulder pain. EXAM: LEFT HAND - 2 VIEW COMPARISON:  Contralateral right hand series today. FINDINGS:  Bone mineralization is within normal limits for age. No acute osseous abnormality identified. Joint spaces and alignment are within normal limits for age. Some IP osteoarthritis. There is dorsal MCP region soft tissue swelling evident. Questionable soft tissue swelling also at the 3rd PIP. No soft tissue gas. No radiopaque foreign body identified. IMPRESSION: Dorsal MCP region soft tissue swelling. No radiopaque foreign body or acute osseous abnormality. Electronically Signed   By: Odessa Fleming M.D.   On: 01/29/2023 08:37   DG Hand 2 View Right  Result Date: 01/29/2023 CLINICAL DATA:  72 year old male with bilateral hand swelling. Shoulder pain. EXAM: RIGHT HAND - 2 VIEW COMPARISON:  None Available. FINDINGS: Bone mineralization is within normal limits for age. Joint spaces and alignment are within normal limits for age. IP osteophytosis compatible with a degree of osteoarthritis. No acute osseous abnormality identified. No soft tissue gas or discrete soft tissue abnormality. IMPRESSION: No acute radiographic finding, normal for age. Electronically Signed   By: Odessa Fleming M.D.   On: 01/29/2023 08:36    Procedures Procedures    Medications Ordered in ED Medications  doxycycline (VIBRA-TABS) tablet 100 mg (has no administration in time range)  ketorolac (TORADOL) 15 MG/ML injection 15 mg (has no administration in time range)    ED Course/ Medical Decision Making/ A&P                             Medical Decision Making Amount and/or Complexity of Data Reviewed Labs: ordered. Radiology: ordered.  Risk Prescription drug management.   72 yo M with a chief complaint of bilateral hand swelling has been going on for the better part of the week.  He has no history of this in the past.  No obvious signs or symptoms consistent with endocarditis.  Will screen for STDs.  Started on Doxy for possible tickborne illness.  Treat with anti-inflammatory medicines for possible gout.  Have him follow-up with his  family doctor in the office.  Plain film of the hands bilaterally independently interpreted by me without fracture or obvious soft tissue gas.  9:55 AM:  I have discussed the diagnosis/risks/treatment options with the patient.  Evaluation and diagnostic testing in the emergency department does not suggest an emergent condition requiring admission or immediate intervention beyond what has been performed at this time.  They will  follow up with PCP. We also discussed returning to the ED immediately if new or worsening sx occur. We discussed the sx which are most concerning (e.g., sudden worsening pain, fever, inability to tolerate by mouth) that necessitate immediate return. Medications administered to the patient during their visit and any new prescriptions provided to the patient are listed below.  Medications given during this visit Medications  doxycycline (VIBRA-TABS) tablet 100 mg (has no administration in time range)  ketorolac (TORADOL) 15 MG/ML injection 15 mg (has no administration in time range)     The patient appears reasonably screen and/or stabilized for discharge and I doubt any other medical condition or other [facility] requiring further screening, evaluation, or treatment in the ED at this time prior to discharge.          Final Clinical Impression(s) / ED Diagnoses Final diagnoses:  Hand swelling    Rx / DC Orders ED Discharge Orders          Ordered    doxycycline (VIBRAMYCIN) 100 MG capsule  2 times daily        01/29/23 0945    colchicine 0.6 MG tablet        01/29/23 0945              Melene Plan, DO 01/29/23 [phone]

## 2023-01-29 NOTE — ED Triage Notes (Signed)
Pt arrived via POV. C/o joint swelling in both hands that began yesterday. No hx of similar. No c/o pain.  AOx4

## 2023-01-29 NOTE — Discharge Instructions (Signed)
I am not sure what is the cause of your joint swelling on both sides.  I have started you on antibiotics for possible infectious cause.  I have also sent off some blood work to screen you for some different infectious causes.  I would have you take medicines like ibuprofen or naproxen for this.  Max dosing as below.  Please follow-up with your family doctor.  They may need to do some further lab testing or refer you to a specialist.  Take 4 over the counter ibuprofen tablets 3 times a day or 2 over-the-counter naproxen tablets twice a day for pain. Also take tylenol 1000mg (2 extra strength) four times a day.

## 2023-01-31 ENCOUNTER — Telehealth: Payer: Self-pay

## 2023-01-31 NOTE — Telephone Encounter (Signed)
He can take the turmeric and be placed on cancellation list to be seen sooner

## 2023-01-31 NOTE — Telephone Encounter (Signed)
Pt called stating he had recent ER visit and he called for a f/u appt and was unable to get in until 08/12. Pt would like to know if he can be worked in sooner as he is still experiencing swelling in his hands and joints that is extremely painful and also if he can take Tumeric a natural supplement (wants to make sure it wont interfere with his Rx meds)

## 2023-02-01 NOTE — Telephone Encounter (Signed)
Please place pt on cancellation list.

## 2023-02-03 ENCOUNTER — Telehealth: Payer: Self-pay | Admitting: *Deleted

## 2023-02-03 NOTE — Telephone Encounter (Signed)
Transition Care Management Follow-up Telephone Call Date of discharge and from where: [facility] How have you been since you were released from the hospital? Not 100 % Any questions or concerns? Yes Not 100% cannot get into the practice until the 12th and called on july 30.2024 un happy he cannot getin sooner   Items Reviewed: Did the pt receive and understand the discharge instructions provided? Yes  Medications obtained and verified? No  Other? No  Any new allergies since your discharge? No  Dietary orders reviewed? No Do you have support at home? No    Follow up appointments reviewed:  PCP Hospital f/u appt confirmed? Yes  Scheduled to see   PCP on 02/13/2023 as it was the earliest available appt , not happy to not be able see him sooner   Are transportation arrangements needed? No  If their condition worsens, is the pt aware to call PCP or go to the Emergency Dept.? Yes Was the patient provided with contact information for the PCP's office or ED? Yes Was to pt encouraged to call back with questions or concerns? Yes

## 2023-02-06 ENCOUNTER — Telehealth: Payer: Self-pay

## 2023-02-06 ENCOUNTER — Encounter: Payer: Self-pay | Admitting: Family Medicine

## 2023-02-06 ENCOUNTER — Ambulatory Visit (INDEPENDENT_AMBULATORY_CARE_PROVIDER_SITE_OTHER): Payer: TRICARE For Life (TFL) | Admitting: Family Medicine

## 2023-02-06 ENCOUNTER — Telehealth: Payer: Self-pay | Admitting: *Deleted

## 2023-02-06 NOTE — Telephone Encounter (Signed)
Pt called c/o still having joint pain that is not getting better. He does have a visit with you soon. He called the other day to see if he can be worked in sooner (pt was placed on cancellation list) pt still reports same discomfort and swelling as when he previously called. Pt has been taking tylenol around the clock and wants to know if you can look at the labs that were drawn in the ER and prescribe him something to give him relief until you can see him b/c he does not want to keep taking the tylenol and damage his kidneys.

## 2023-02-06 NOTE — Progress Notes (Signed)
  Care Coordination   Note   02/06/2023 Name: David Gilmore MRN: [id number] DOB: December 15, 1950  David Gilmore is a 72 y.o. year old male who sees Durene Cal, Aldine Contes, MD for primary care. I reached out to Laure Kidney by phone today to offer care coordination services.  Mr. Poniatowski was given information about Care Coordination services today including:   The Care Coordination services include support from the care team which includes your Nurse Coordinator, Clinical Social Worker, or Pharmacist.  The Care Coordination team is here to help remove barriers to the health concerns and goals most important to you. Care Coordination services are voluntary, and the patient may decline or stop services at any time by request to their care team member.   Care Coordination Consent Status: Patient did not agree to participate in care coordination services at this time.  Follow up plan:  none indicated - pt declined need for services at this time   Encounter Outcome:  Pt. Refused  Burman Nieves, CCMA Care Coordination Care Guide Direct Dial: [phone]

## 2023-02-06 NOTE — Telephone Encounter (Signed)
Called and spoke with pt and he is able to vome in at 4:20 today, he will be added to our schedule.

## 2023-02-06 NOTE — Telephone Encounter (Signed)
Looking at schedule- I could do 4 20 today but not likely to have lab available at that time- would he be able to make that so we can look at the whole picture together?

## 2023-02-06 NOTE — Progress Notes (Signed)
Patient had an extended wait and decided to go home after he discovered that he had colchicine and an antibiotic sent to him from the emergency department which she had not taken yet-you can go straight to the pharmacy and wanted to update [place] with how he is feeling at a later date-we were scheduled for the 12th but it appears that he canceled that when they scheduled him for today's  Spine team to look into getting him rescheduled

## 2023-02-13 ENCOUNTER — Ambulatory Visit: Payer: Medicare Other | Admitting: Family Medicine

## 2023-02-17 ENCOUNTER — Encounter: Payer: Self-pay | Admitting: Family Medicine

## 2023-02-17 ENCOUNTER — Ambulatory Visit (INDEPENDENT_AMBULATORY_CARE_PROVIDER_SITE_OTHER): Payer: Medicare PPO | Admitting: Family Medicine

## 2023-02-17 VITALS — BP 120/80 | HR 96 | Temp 97.7°F | Ht 71.0 in | Wt 180.0 lb

## 2023-02-17 DIAGNOSIS — M2540 Effusion, unspecified joint: Secondary | ICD-10-CM

## 2023-02-17 DIAGNOSIS — E785 Hyperlipidemia, unspecified: Secondary | ICD-10-CM

## 2023-02-17 DIAGNOSIS — I10 Essential (primary) hypertension: Secondary | ICD-10-CM

## 2023-02-17 LAB — COMPREHENSIVE METABOLIC PANEL
ALT: 36 U/L (ref 0–53)
AST: 26 U/L (ref 0–37)
Albumin: 4 g/dL (ref 3.5–5.2)
Alkaline Phosphatase: 71 U/L (ref 39–117)
BUN: 20 mg/dL (ref 6–23)
CO2: 29 mEq/L (ref 19–32)
Calcium: 10.5 mg/dL (ref 8.4–10.5)
Chloride: 96 mEq/L (ref 96–112)
Creatinine, Ser: 1.04 mg/dL (ref 0.40–1.50)
GFR: 71.32 mL/min (ref >60.00)
Glucose, Bld: 82 mg/dL (ref 70–99)
Potassium: 3.5 mEq/L (ref 3.5–5.1)
Sodium: 132 mEq/L — ABNORMAL LOW (ref 135–145)
Total Bilirubin: 0.4 mg/dL (ref 0.2–1.2)
Total Protein: 8 g/dL (ref 6.0–8.3)

## 2023-02-17 LAB — URIC ACID: Uric Acid, Serum: 4.8 mg/dL (ref 4.0–7.8)

## 2023-02-17 LAB — TSH: TSH: 1.57 u[IU]/mL (ref 0.35–5.50)

## 2023-02-17 LAB — C-REACTIVE PROTEIN: CRP: 3.7 mg/dL (ref 0.5–20.0)

## 2023-02-17 LAB — SEDIMENTATION RATE: Sed Rate: 94 mm/hr — ABNORMAL HIGH (ref 0–20)

## 2023-02-17 MED ORDER — LOSARTAN POTASSIUM 50 MG PO TABS: 50.0000 mg | ORAL_TABLET | Freq: Every day | ORAL | 3 refills | Status: DC

## 2023-02-17 MED ORDER — PREDNISONE 20 MG PO TABS: ORAL_TABLET | ORAL | 0 refills | Status: DC

## 2023-02-17 NOTE — Patient Instructions (Addendum)
Let us know if you get your flu or COVID vaccine this fall.  Prednisone to see if we can calm all the joints down  Strongly considering sports medicine referral if not improving  I wonder if high calcium is contributing -stop the hydrochlorothiazide -pick up losartan 50 mg and use this for blood pressure instead  Please stop by lab before you go If you have mychart- we will send your results within 3 business days of [place] receiving them.  If you do not have mychart- we will call you about results within 5 business days of [place] receiving them.  *please also note that you will see labs on mychart as soon as they post. I will later go in and write notes on them- will say "notes from Dr. Durene Cal"   Recommended follow up: Return in about 25 days (around 03/14/2023) for USE SAME DAY SLOT PLEASE.  -this is for blood pressure recheck but if your pain and swelling and weakness are not better within 7 days let me refer you to sports medicine - reach out to [place]

## 2023-02-17 NOTE — Addendum Note (Signed)
Addended by: Shelva Majestic on: 02/17/2023 05:49 PM   Modules accepted: Level of Service

## 2023-02-17 NOTE — Progress Notes (Signed)
Phone [phone] In person visit   Subjective:   David Gilmore is a 72 y.o. year old very pleasant male patient who presents for/with See problem oriented charting Chief Complaint  Patient presents with   ER f/u    Pt states he is still having hand swelling even after taking antibiotic and gout medicine.   Past Medical History-  Patient Active Problem List   Diagnosis Date Noted   PAD (peripheral artery disease) (HCC) 09/19/2018    Priority: High   Mallory-Weiss tear     Priority: High   History of heart failure     Priority: High   Anemia 08/23/2017    Priority: High   Aortic atherosclerosis (HCC) 08/16/2017    Priority: Medium    BPH associated with nocturia 08/16/2017    Priority: Medium    Essential hypertension 06/22/2015    Priority: Medium    Hyperlipidemia     Priority: Medium    Shortness of breath 08/16/2017    Priority: Low   Abnormal stress test 07/27/2015    Priority: Low   Former smoker 06/15/2015    Priority: Low    Medications- reviewed and updated Current Outpatient Medications  Medication Sig Dispense Refill   amLODipine (NORVASC) 10 MG tablet TAKE 1 TABLET(10 MG) BY MOUTH DAILY 90 tablet 3   aspirin 81 MG tablet Take 81 mg by mouth daily.     cholecalciferol (VITAMIN D3) 25 MCG (1000 UT) tablet Take 1,000 Units by mouth daily.     Ferrous Sulfate (IRON PO) Take by mouth. once a week     losartan (COZAAR) 50 MG tablet-but prior to visit was taking hydrochlorothiazide 25 mg Take 1 tablet (50 mg total) by mouth daily. 90 tablet 3   rosuvastatin (CRESTOR) 40 MG tablet TAKE 1 TABLET(40 MG) BY MOUTH DAILY 90 tablet 3   sildenafil (VIAGRA) 100 MG tablet Take 1 tablet (100 mg total) by mouth daily as needed for erectile dysfunction. (Patient not taking: Reported on 02/17/2023) 30 tablet 3   No current facility-administered medications for this visit.     Objective:  BP 120/80   Pulse 96   Temp 97.7 F (36.5 C)   Ht 5\' 11"  (1.803 m)   Wt  180 lb (81.6 kg)   SpO2 96%   BMI 25.10 kg/m  Gen: NAD, resting comfortably CV: RRR (high normal heart rate) no murmurs rubs or gallops Lungs: CTAB no crackles, wheeze, rhonchi Ext: Minimal edema in legs-hands edematous across MCP joints-tender to touch on right MCP joint second finger but otherwise no pain.  Has significant pain with abduction past 90 degrees and less significant pain with flexion past 90 degrees of the shoulders.    Assessment and Plan   # Bilateral hand swelling and bilateral hand pain S: Patient presented to the emergency department on 01/29/2023 with complaints of bilateral hand pain and swelling for about a week-he had seen me approximately 12 days prior.  It started more in the left arm and he thought it could be related to the way he slept on the couch but did not started noting both hands swelling on and off then became more persistent- pain up to 8/10 when went into hospital. .  When he had something salty it seemed to worsen.  He denied trauma to the hand  Plain films of the hands were obtained and no fracture was noted though soft tissue swelling particular around the MCP was noted on the left hand where his  right hand x-rays were noted as normal other than arthritis noted -He was given doxycycline in case there is bacterial element as well as Toradol injection - He was discharged on colchicine -he reflects back that he did have some redness on hand- that resolved on antibiotics  Since leaving the hospital he has taken the colchicine and antibiotic and has not noted any improvement in swelling in fact seems worse. Pain more mild at home- notes pain in both shoulders- hard lifting arms up. Ibuprofen helps. No issue interest he hips.  -he also reports bilateral shoulder pain this whole time -notes weakness due to pain A/P: Patient with several weeks of bilateral hand swelling and pain - He has known hypercalcemia and we had tried to stop his hydrochlorothiazide but  he had not received a message despite several calls-we are going to stop the hydrochlorothiazide (see hypertension section below) and see him back in several weeks to recheck.  Hypercalcemia can be associated with bony pain though I would not typically associated with edema like he has around his joints on his hands - I think gout is less likely since he did not respond to colchicine but we will still check a uric acid.  Hydrochlorothiazide would also increase gout risk so reducing that would help.  I also wonder about pseudogout but would be interesting to be in multiple joints-with his level of discomfort I do think a trial of prednisone is reasonable - Check ESR, CRP with joint swelling and pain.  I considered polymyalgia rheumatica but has no hip/significant central lower body pain and symptoms are worse when he lays down in the evening-not necessarily worse in the morning -I asked him to update me if he fails to improve but otherwise even checking we do his blood pressure check  #hypertension # history of heart failure= EF normalized with better blood pressure control S: medication: amlodipine 10Mg , valsartan 160 mg instead of-Stopped HCTZ 25Mg  due to hypercalcemia BUT he never got message to stop hydrochlorothiazide and start valsartan A/P: Blood pressure is well-controlled but with hypercalcemia we are going to once again advised him to stop the hydrochlorothiazide which he is still taking at present.  He will continue amlodipine 10 mg and we will start losartan 50 mg instead of valsartan-we called pharmacy to confirm-in case there is any gap here losartan would lower the risk -To clarify he is currently taking amlodipine 10 mg, adequate tablet 25 mg and after the visit today he should be taking amlodipine 10 mg and losartan 50 mg  # Hyperlipidemia-cholesterol has been reasonably well-controlled-continue rosuvastatin 40 mg.  I do not strongly suspect this is causing his hand or shoulder issues but  can be associated with myalgias or arthralgias.  I also opted to check a TSH given hyperlipidemia and heart rate tending to trend higher than could be related to discomfort (denies chest pain or shortness of breath) Lab Results  Component Value Date   CHOL 126 01/17/2023   HDL 33.20 (L) 01/17/2023   LDLCALC 55 01/17/2023   LDLDIRECT 66.0 12/20/2018   TRIG 187.0 (H) 01/17/2023   CHOLHDL 4 01/17/2023    Recommended follow up: Return in about 25 days (around 03/14/2023) for USE SAME DAY SLOT PLEASE. Future Appointments  Date Time Provider Department Center  03/14/2023 11:20 AM Shelva Majestic, MD [facility]  03/30/2023  9:30 AM [facility] ANNUAL WELLNESS VISIT 1 [facility]  07/20/2023 10:20 AM Durene Cal Aldine Contes, MD [facility]    Lab/Order associations:  ICD-10-CM   1. Joint swelling  M25.40 Uric acid    Sedimentation rate    C-reactive protein    2. Essential hypertension  I10 Comprehensive metabolic panel    3. Hyperlipidemia, unspecified hyperlipidemia type  E78.5 TSH      Meds ordered this encounter  Medications   losartan (COZAAR) 50 MG tablet    Sig: Take 1 tablet (50 mg total) by mouth daily.    Dispense:  90 tablet    Refill:  3   predniSONE (DELTASONE) 20 MG tablet    Sig: Take 2 pills for 3 days, 1 pill for 4 days    Dispense:  10 tablet    Refill:  0    Return precautions advised.  Tana Conch, MD

## 2023-03-14 ENCOUNTER — Encounter: Payer: Self-pay | Admitting: Family Medicine

## 2023-03-14 ENCOUNTER — Ambulatory Visit (INDEPENDENT_AMBULATORY_CARE_PROVIDER_SITE_OTHER): Payer: Medicare PPO | Admitting: Family Medicine

## 2023-03-14 VITALS — BP 156/80 | HR 96 | Temp 98.7°F | Ht 71.0 in | Wt 183.6 lb

## 2023-03-14 DIAGNOSIS — I10 Essential (primary) hypertension: Secondary | ICD-10-CM

## 2023-03-14 DIAGNOSIS — Z23 Encounter for immunization: Secondary | ICD-10-CM | POA: Diagnosis not present

## 2023-03-14 DIAGNOSIS — R700 Elevated erythrocyte sedimentation rate: Secondary | ICD-10-CM | POA: Diagnosis not present

## 2023-03-14 DIAGNOSIS — M2540 Effusion, unspecified joint: Secondary | ICD-10-CM | POA: Diagnosis not present

## 2023-03-14 MED ORDER — FLUTICASONE PROPIONATE 50 MCG/ACT NA SUSP: 2.0000 | Freq: Every day | NASAL | 1 refills | Status: AC

## 2023-03-14 NOTE — Patient Instructions (Addendum)
If ear pressure and headaches not getting better with Flonase for 2 weeks and taking blood pressure medicine again please let me know.   Update me with blood pressure in 2 weeks by phone call or mychart  Refer to rheumatology- if your pain comes back I want to have you see someone in office and also to likely restart prednionse  We have placed a referral for you today to rheumatology. In some cases you will see # listed below- you can call this if you have not heard within a week. If you do not see # listed- you should receive a mychart message or phone call within a week with the # to call directly- call that as soon as you get it. If you are having issues getting scheduled reach out to [place] again.   Recommended follow up: Return for next already scheduled visit or sooner if needed.

## 2023-03-14 NOTE — Progress Notes (Signed)
Phone [phone] In person visit   Subjective:   David Gilmore is a 72 y.o. year old very pleasant male patient who presents for/with See problem oriented charting Chief Complaint  Patient presents with   Joint Swelling    Past Medical History-  Patient Active Problem List   Diagnosis Date Noted   PAD (peripheral artery disease) (HCC) 09/19/2018    Priority: High   Mallory-Weiss tear     Priority: High   History of heart failure     Priority: High   Anemia 08/23/2017    Priority: High   Aortic atherosclerosis (HCC) 08/16/2017    Priority: Medium    BPH associated with nocturia 08/16/2017    Priority: Medium    Essential hypertension 06/22/2015    Priority: Medium    Hyperlipidemia     Priority: Medium    Shortness of breath 08/16/2017    Priority: Low   Abnormal stress test 07/27/2015    Priority: Low   Former smoker 06/15/2015    Priority: Low    Medications- reviewed and updated Current Outpatient Medications  Medication Sig Dispense Refill   amLODipine (NORVASC) 10 MG tablet TAKE 1 TABLET(10 MG) BY MOUTH DAILY 90 tablet 3   aspirin 81 MG tablet Take 81 mg by mouth daily.     cholecalciferol (VITAMIN D3) 25 MCG (1000 UT) tablet Take 1,000 Units by mouth daily.     Ferrous Sulfate (IRON PO) Take by mouth. once a week     fluticasone (FLONASE) 50 MCG/ACT nasal spray Place 2 sprays into both nostrils daily. 16 g 1   losartan (COZAAR) 50 MG tablet Take 1 tablet (50 mg total) by mouth daily. 90 tablet 3   predniSONE (DELTASONE) 20 MG tablet Take 2 pills for 3 days, 1 pill for 4 days 10 tablet 0   rosuvastatin (CRESTOR) 40 MG tablet TAKE 1 TABLET(40 MG) BY MOUTH DAILY 90 tablet 3   sildenafil (VIAGRA) 100 MG tablet Take 1 tablet (100 mg total) by mouth daily as needed for erectile dysfunction. 30 tablet 3   No current facility-administered medications for this visit.     Objective:  BP (!) 156/80   Pulse 96   Temp 98.7 F (37.1 C)   Ht 5\' 11"  (1.803 m)    Wt 183 lb 9.6 oz (83.3 kg)   SpO2 98%   BMI 25.61 kg/m  Gen: NAD, resting comfortably Air-fluid levels on bilateral ears noted.  Some nasal congestion with clear drainage and nasal turbinate edema noted, some drainage in pharynx CV: RRR no murmurs rubs or gallops Lungs: CTAB no crackles, wheeze, rhonchi Abdomen: soft/nontender/nondistended/normal bowel sounds. No rebound or guarding.  Ext: no edema in legs, bilateral joint swelling at MCP first and second and third fingers bilaterally with mild tenderness-improvement from last visit Skin: warm, dry     Assessment and Plan   # Joint swelling and pain S:last visit patient continued to have significant joint swelling particularly at the metacarpophalangeal joint joints both hands since mid July with pain up to 8/10 that was worse when eating salt with swelling and pain. Ended up in Emergency Department 01/29/23 and plain films showed no fracture but did have soft tissue swelling at  metacarpophalangeal joint joint on right and left hand. There was some concern could havebacterial infection and started on doxycycline and given Toradol as well as colchcine. He took colchicine and doxycline without relief.  He saw me back on 02/17/23 and also noted som epain in  both shoulders with lifting arms up over head but ibuprofen helped some with that. No hip issues and no elbow issues. He did not feel weak other than weakness related to avoiding pain. Previously when laying down at night noted pain with whatever side he was on backup tdid not have worsening pain in the morning- did note some stiffness but improved within 30 minutes.   With several weeks of bilateral hand swelling and pain and hypercalcemia we stopped his hydrochlorothiazide  and we also placed him on prednisone. CRP was normal but ESR very elevated at 98.   He reports with week course of prednisone that hand pain went down to 2-3/10 and he is still feeling much better but still notes pain  in his hands and swelling particularly at night and with stifness in the morning.  - shoulders improved and states still some stiffness in the morning but resolves within 30 minutes  Since last visit noted ear fullness and some frontal headaches that come and go- reports has not been taking blood pressure medications as appetite lowr.  Feels like they need to pop.  A/P: 72 year old male with significant bilateral MCP swelling and pain of at least the second and third fingers with ESR up to 98 as well as bilateral shoulder pain (without elbow pain or hip/lower body pain) that responded well to prednisone with pain down from 8 to 2 or 3 out of 10 in the hands. - Lingering joint swelling and pain and with prior elevated ESR will refer to rheumatology. - Did not describe pain is worse in the morning we thought the likelihood of polymyalgia rheumatica was lower but now comes back and describes some joint stiffness in the morning with the shoulders up to 30 minutes but pain is still not particularly worse in the morning.  I do not feel confident enough in the PMR diagnosis and that would not explain the hand swelling.  I also doubt infectious cause-had no improvement on doxycycline from emergency department.  Also did not respond to colchicine and doubt gout. - Ultimately decided to refer to rheumatology for their expert opinion - Discussed very low threshold to restart prednisone and also to repeat labs-he should let us know as soon as possible if symptoms worsen between now and rheumatology visit  He also has bilateral otitis media with effusion and recommended trial of Flonase-has had some congestion for 2 weeks and suspect this will improve with consistent use.  Mild muffled hearing-if any worsening we should refer to ENT or certainly if fails to improve within a month-can update me when he updates me about blood pressure -No pain around temples-headaches are all frontal and I think it is worth a trial of  Flonase before doing neuroimaging.  Doubt temporal arteritis/giant cell arteritis  #hypertension # history of heart failure= EF normalized with better blood pressure control S: medication: amlodipine 10Mg , losartan 50 mg but has missed in last few days- wife used to cook and without her he just doesn't feel like cooking and makes him less likely to take his meals- eating out more could also raise salt content -Stopped HCTZ 25Mg  due to hypercalcemia Home readings #s: not checking BP Readings from Last 3 Encounters:  03/14/23 (!) 156/80  02/17/23 120/80  02/06/23 138/86  A/P: Blood pressure is elevated/poorly controlled but patient has missed medicine for several days-she agrees to restart and update me within 2 weeks  Recommended follow up: Return for next already scheduled visit or sooner if needed.  Future Appointments  Date Time Provider Department Center  03/30/2023  9:30 AM [facility] ANNUAL WELLNESS VISIT 1 [facility]  07/20/2023 10:20 AM Durene Cal Aldine Contes, MD [facility]    Lab/Order associations:   ICD-10-CM   1. Joint swelling  M25.40 Ambulatory referral to Rheumatology    2. Elevated sed rate  R70.0 Ambulatory referral to Rheumatology    3. Encounter for immunization  Z23 Flu Vaccine Trivalent High Dose (Fluad)      Meds ordered this encounter  Medications   fluticasone (FLONASE) 50 MCG/ACT nasal spray    Sig: Place 2 sprays into both nostrils daily.    Dispense:  16 g    Refill:  1    Return precautions advised.  Tana Conch, MD

## 2023-05-24 ENCOUNTER — Ambulatory Visit (INDEPENDENT_AMBULATORY_CARE_PROVIDER_SITE_OTHER): Payer: Medicare PPO

## 2023-05-24 VITALS — Wt 183.0 lb

## 2023-05-24 DIAGNOSIS — Z0000 Encounter for general adult medical examination without abnormal findings: Secondary | ICD-10-CM
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
## 2023-05-24 NOTE — Addendum Note (Signed)
Addended by: Marzella Schlein on: 05/24/2023 01:27 PM   Modules accepted: Level of Service

## 2023-05-24 NOTE — Patient Instructions (Signed)
David Gilmore , Thank you for taking time to come for your Medicare Wellness Visit. I appreciate your ongoing commitment to your health goals. Please review the following plan we discussed and let me know if I can assist you in the future.   Referrals/Orders/Follow-Ups/Clinician Recommendations: maintain health and actvitity  This is a list of the screening recommended for you and due dates:  Health Maintenance  Topic Date Due   Zoster (Shingles) Vaccine (1 of 2) Never done   COVID-19 Vaccine (7 - 2023-24 season) 03/05/2023   Medicare Annual Wellness Visit  03/25/2023   Colon Cancer Screening  10/24/2024   Pneumonia Vaccine  Completed   Flu Shot  Completed   Hepatitis C Screening  Completed   HPV Vaccine  Aged Out   DTaP/Tdap/Td vaccine  Discontinued    Advanced directives: (Declined) Advance directive discussed with you today. Even though you declined this today, please call our office should you change your mind, and we can give you the proper paperwork for you to fill out.  Next Medicare Annual Wellness Visit scheduled for next year: Yes

## 2023-06-08 ENCOUNTER — Other Ambulatory Visit: Payer: Self-pay | Admitting: Family Medicine

## 2023-07-20 ENCOUNTER — Ambulatory Visit: Payer: Medicare PPO | Admitting: Family Medicine

## 2023-07-20 ENCOUNTER — Encounter: Payer: Self-pay | Admitting: Family Medicine

## 2023-07-20 VITALS — BP 130/80 | HR 93 | Temp 97.7°F | Ht 71.0 in | Wt 184.0 lb

## 2023-07-20 DIAGNOSIS — I700 Atherosclerosis of aorta: Secondary | ICD-10-CM | POA: Diagnosis not present

## 2023-07-20 DIAGNOSIS — E785 Hyperlipidemia, unspecified: Secondary | ICD-10-CM | POA: Diagnosis not present

## 2023-07-20 DIAGNOSIS — I10 Essential (primary) hypertension: Secondary | ICD-10-CM | POA: Diagnosis not present

## 2023-07-20 DIAGNOSIS — I739 Peripheral vascular disease, unspecified: Secondary | ICD-10-CM | POA: Diagnosis not present

## 2023-07-20 NOTE — Progress Notes (Signed)
Phone [phone] In person visit   Subjective:   David Gilmore is a 73 y.o. year old very pleasant male patient who presents for/with See problem oriented charting Chief Complaint  Patient presents with   Medical Management of Chronic Issues   Hypertension   Past Medical History-  Patient Active Problem List   Diagnosis Date Noted   PAD (peripheral artery disease) (HCC) 09/19/2018    Priority: High   Mallory-Weiss tear     Priority: High   History of heart failure     Priority: High   Anemia 08/23/2017    Priority: High   Aortic atherosclerosis (HCC) 08/16/2017    Priority: Medium    BPH associated with nocturia 08/16/2017    Priority: Medium    Essential hypertension 06/22/2015    Priority: Medium    Hyperlipidemia     Priority: Medium    Shortness of breath 08/16/2017    Priority: Low   Abnormal stress test 07/27/2015    Priority: Low   Former smoker 06/15/2015    Priority: Low    Medications- reviewed and updated Current Outpatient Medications  Medication Sig Dispense Refill   amLODipine (NORVASC) 10 MG tablet TAKE 1 TABLET(10 MG) BY MOUTH DAILY 90 tablet 3   aspirin 81 MG tablet Take 81 mg by mouth daily.     cholecalciferol (VITAMIN D3) 25 MCG (1000 UT) tablet Take 1,000 Units by mouth daily.     Ferrous Sulfate (IRON PO) Take by mouth. once a week     fluticasone (FLONASE) 50 MCG/ACT nasal spray Place 2 sprays into both nostrils daily. 16 g 1   losartan (COZAAR) 50 MG tablet Take 1 tablet (50 mg total) by mouth daily. 90 tablet 3   rosuvastatin (CRESTOR) 40 MG tablet TAKE 1 TABLET(40 MG) BY MOUTH DAILY 90 tablet 3   sildenafil (VIAGRA) 100 MG tablet TAKE 1 TABLET(100 MG) BY MOUTH DAILY AS NEEDED FOR ERECTILE DYSFUNCTION 30 tablet 3   No current facility-administered medications for this visit.     Objective:  BP 130/80   Pulse 93   Temp 97.7 F (36.5 C)   Ht 5\' 11"  (1.803 m)   Wt 184 lb (83.5 kg)   SpO2 99%   BMI 25.66 kg/m  Gen: NAD,  resting comfortably CV: RRR no murmurs rubs or gallops Lungs: CTAB no crackles, wheeze, rhonchi Ext: trace edema Skin: warm, dry    Assessment and Plan   #social update- planning of the move back to [place] or [place] area - a lot of fmaily down there  #hyperlipidemia #aortic atherosclerosis- incidental finding but LDL goal under 70 #Peripheral arterial disease with LDL goal under 70.  Noted on arterial study 07/02/2018-"Left: Resting left ankle-brachial index indicates moderate left lower extremity arterial disease. The left toe-brachial index is abnormal. CW waveforms suggest tibial disease." S: Medication: Rosuvastatin 40MG , aspirin 81 mg. Some cramps Denies claudication  again today Lab Results  Component Value Date   CHOL 126 01/17/2023   HDL 33.20 (L) 01/17/2023   LDLCALC 55 01/17/2023   LDLDIRECT 66.0 12/20/2018   TRIG 187.0 (H) 01/17/2023   CHOLHDL 4 01/17/2023   A/P: cholesterol at goal even for aortic atherosclerosis.  Aortic atherosclerosis (presumed stable)- LDL goal ideally <70 - continue current medications and repeat lipids next visit Also at goal for peripheral arterial disease  Peripheral arterial disease asymptomatic - continue to monitor   #hypertension # history of heart failure= EF normalized with better blood pressure control S: medication:  amlodipine 10Mg , losartan 50 mg -Stopped HCTZ 25Mg  due to hypercalcemia Home readings #s: doesn't check BP Readings from Last 3 Encounters:  07/20/23 130/80  03/14/23 (!) 156/80  02/17/23 120/80  A/P: stable- continue current medicines  -slightly low sodium last visit- offered repeat today but he wants to wait until next visit- he states eats very low salt diet in last year or so  # anemia- ferritin has been low normal- twice a week iron recommended- down to once a week in 2023. No anemia last check- continue to monitor - hold off otday Lab Results  Component Value Date   WBC 5.2 01/17/2023   HGB 15.0 01/17/2023    HCT 45.2 01/17/2023   MCV 82.3 01/17/2023   PLT 265.0 01/17/2023   # Joint swelling of bilateral MCP with elevated ESR in the past that seem to respond well to prednisone-we referred to rheumatology for their expert opinion -he states hand swelling went back down and he wanted to hold off on evaluation  Recommended follow up: Return in about 6 months (around 01/17/2024) for physical or sooner if needed.Schedule b4 you leave. Future Appointments  Date Time Provider Department Center  06/03/2024  1:00 PM [facility] ANNUAL WELLNESS VISIT 1 [facility]    Lab/Order associations:   ICD-10-CM   1. Hyperlipidemia, unspecified hyperlipidemia type  E78.5     2. Aortic atherosclerosis (HCC)  I70.0     3. Essential hypertension  I10     4. PAD (peripheral artery disease) (HCC)  I73.9       No orders of the defined types were placed in this encounter.   Return precautions advised.  Tana Conch, MD

## 2023-07-20 NOTE — Patient Instructions (Addendum)
No changes today- glad you are doing well all things considered  Recommended follow up: Return in about 6 months (around 01/17/2024) for physical or sooner if needed.Schedule b4 you leave.

## 2023-07-27 ENCOUNTER — Other Ambulatory Visit: Payer: Self-pay

## 2023-07-27 DIAGNOSIS — I10 Essential (primary) hypertension: Secondary | ICD-10-CM

## 2023-07-27 DIAGNOSIS — E785 Hyperlipidemia, unspecified: Secondary | ICD-10-CM

## 2023-07-27 DIAGNOSIS — Z0000 Encounter for general adult medical examination without abnormal findings: Secondary | ICD-10-CM

## 2023-07-28 ENCOUNTER — Other Ambulatory Visit: Payer: TRICARE For Life (TFL)

## 2023-07-28 DIAGNOSIS — Z0000 Encounter for general adult medical examination without abnormal findings: Secondary | ICD-10-CM

## 2023-07-28 DIAGNOSIS — E785 Hyperlipidemia, unspecified: Secondary | ICD-10-CM

## 2023-07-28 DIAGNOSIS — I10 Essential (primary) hypertension: Secondary | ICD-10-CM

## 2023-07-28 LAB — LIPID PANEL
Cholesterol: 124 mg/dL (ref 0–200)
HDL: 36.1 mg/dL — ABNORMAL LOW (ref >39.00)
LDL Cholesterol: 62 mg/dL (ref 0–99)
NonHDL: 87.64
Total CHOL/HDL Ratio: 3
Triglycerides: 130 mg/dL (ref 0.0–149.0)
VLDL: 26 mg/dL (ref 0.0–40.0)

## 2023-07-28 LAB — COMPREHENSIVE METABOLIC PANEL
ALT: 37 U/L (ref 0–53)
AST: 29 U/L (ref 0–37)
Albumin: 4.4 g/dL (ref 3.5–5.2)
Alkaline Phosphatase: 64 U/L (ref 39–117)
BUN: 10 mg/dL (ref 6–23)
CO2: 29 meq/L (ref 19–32)
Calcium: 10.1 mg/dL (ref 8.4–10.5)
Chloride: 103 meq/L (ref 96–112)
Creatinine, Ser: 1.18 mg/dL (ref 0.40–1.50)
GFR: 61.1 mL/min (ref >60.00)
Glucose, Bld: 106 mg/dL — ABNORMAL HIGH (ref 70–99)
Potassium: 3.9 meq/L (ref 3.5–5.1)
Sodium: 139 meq/L (ref 135–145)
Total Bilirubin: 0.4 mg/dL (ref 0.2–1.2)
Total Protein: 7.6 g/dL (ref 6.0–8.3)

## 2023-07-28 LAB — TSH: TSH: 1.73 u[IU]/mL (ref 0.35–5.50)

## 2023-07-28 LAB — CBC WITH DIFFERENTIAL/PLATELET
Basophils Absolute: 0 10*3/uL (ref 0.0–0.1)
Basophils Relative: 0.9 % (ref 0.0–3.0)
Eosinophils Absolute: 0.2 10*3/uL (ref 0.0–0.7)
Eosinophils Relative: 4.9 % (ref 0.0–5.0)
HCT: 44.5 % (ref 39.0–52.0)
Hemoglobin: 14.8 g/dL (ref 13.0–17.0)
Lymphocytes Relative: 39.2 % (ref 12.0–46.0)
Lymphs Abs: 1.2 10*3/uL (ref 0.7–4.0)
MCHC: 33.2 g/dL (ref 30.0–36.0)
MCV: 82.6 fL (ref 78.0–100.0)
Monocytes Absolute: 0.5 10*3/uL (ref 0.1–1.0)
Monocytes Relative: 17.3 % — ABNORMAL HIGH (ref 3.0–12.0)
Neutro Abs: 1.2 10*3/uL — ABNORMAL LOW (ref 1.4–7.7)
Neutrophils Relative %: 37.7 % — ABNORMAL LOW (ref 43.0–77.0)
Platelets: 189 10*3/uL (ref 150.0–400.0)
RBC: 5.38 Mil/uL (ref 4.22–5.81)
RDW: 13.7 % (ref 11.5–15.5)
WBC: 3.1 10*3/uL — ABNORMAL LOW (ref 4.0–10.5)

## 2023-11-17 ENCOUNTER — Other Ambulatory Visit: Payer: Self-pay | Admitting: Family Medicine

## 2023-12-01 ENCOUNTER — Other Ambulatory Visit: Payer: Self-pay | Admitting: Family Medicine

## 2024-01-17 ENCOUNTER — Encounter: Payer: TRICARE For Life (TFL) | Admitting: Family Medicine

## 2024-02-14 DIAGNOSIS — J309 Allergic rhinitis, unspecified: Secondary | ICD-10-CM | POA: Diagnosis not present

## 2024-02-14 DIAGNOSIS — Z1329 Encounter for screening for other suspected endocrine disorder: Secondary | ICD-10-CM | POA: Diagnosis not present

## 2024-02-14 DIAGNOSIS — E7800 Pure hypercholesterolemia, unspecified: Secondary | ICD-10-CM | POA: Diagnosis not present

## 2024-02-14 DIAGNOSIS — N529 Male erectile dysfunction, unspecified: Secondary | ICD-10-CM | POA: Diagnosis not present

## 2024-02-14 DIAGNOSIS — Z135 Encounter for screening for eye and ear disorders: Secondary | ICD-10-CM | POA: Diagnosis not present

## 2024-02-14 DIAGNOSIS — Z0000 Encounter for general adult medical examination without abnormal findings: Secondary | ICD-10-CM | POA: Diagnosis not present

## 2024-02-14 DIAGNOSIS — I10 Essential (primary) hypertension: Secondary | ICD-10-CM | POA: Diagnosis not present

## 2024-02-14 DIAGNOSIS — Z1389 Encounter for screening for other disorder: Secondary | ICD-10-CM | POA: Diagnosis not present

## 2024-03-13 ENCOUNTER — Other Ambulatory Visit: Payer: Self-pay | Admitting: Family Medicine

## 2024-03-18 DIAGNOSIS — N529 Male erectile dysfunction, unspecified: Secondary | ICD-10-CM | POA: Diagnosis not present

## 2024-03-18 DIAGNOSIS — I10 Essential (primary) hypertension: Secondary | ICD-10-CM | POA: Diagnosis not present

## 2024-03-18 DIAGNOSIS — E7800 Pure hypercholesterolemia, unspecified: Secondary | ICD-10-CM | POA: Diagnosis not present

## 2024-03-18 DIAGNOSIS — Z1389 Encounter for screening for other disorder: Secondary | ICD-10-CM | POA: Diagnosis not present

## 2024-03-18 DIAGNOSIS — Z135 Encounter for screening for eye and ear disorders: Secondary | ICD-10-CM | POA: Diagnosis not present

## 2024-03-18 DIAGNOSIS — Z125 Encounter for screening for malignant neoplasm of prostate: Secondary | ICD-10-CM | POA: Diagnosis not present

## 2024-03-18 DIAGNOSIS — J309 Allergic rhinitis, unspecified: Secondary | ICD-10-CM | POA: Diagnosis not present

## 2024-03-18 DIAGNOSIS — Z1329 Encounter for screening for other suspected endocrine disorder: Secondary | ICD-10-CM | POA: Diagnosis not present

## 2024-04-08 DIAGNOSIS — Z0000 Encounter for general adult medical examination without abnormal findings: Secondary | ICD-10-CM | POA: Diagnosis not present

## 2024-04-08 DIAGNOSIS — Z1329 Encounter for screening for other suspected endocrine disorder: Secondary | ICD-10-CM | POA: Diagnosis not present

## 2024-04-08 DIAGNOSIS — J309 Allergic rhinitis, unspecified: Secondary | ICD-10-CM | POA: Diagnosis not present

## 2024-04-08 DIAGNOSIS — I10 Essential (primary) hypertension: Secondary | ICD-10-CM | POA: Diagnosis not present

## 2024-04-08 DIAGNOSIS — Z125 Encounter for screening for malignant neoplasm of prostate: Secondary | ICD-10-CM | POA: Diagnosis not present

## 2024-04-08 DIAGNOSIS — K219 Gastro-esophageal reflux disease without esophagitis: Secondary | ICD-10-CM | POA: Diagnosis not present

## 2024-04-08 DIAGNOSIS — Z1389 Encounter for screening for other disorder: Secondary | ICD-10-CM | POA: Diagnosis not present

## 2024-04-08 DIAGNOSIS — N529 Male erectile dysfunction, unspecified: Secondary | ICD-10-CM | POA: Diagnosis not present

## 2024-04-08 DIAGNOSIS — E7800 Pure hypercholesterolemia, unspecified: Secondary | ICD-10-CM | POA: Diagnosis not present

## 2024-04-17 DIAGNOSIS — I209 Angina pectoris, unspecified: Secondary | ICD-10-CM | POA: Diagnosis not present

## 2024-04-17 DIAGNOSIS — I10 Essential (primary) hypertension: Secondary | ICD-10-CM | POA: Diagnosis not present

## 2024-05-21 ENCOUNTER — Telehealth: Payer: Self-pay | Admitting: Family Medicine

## 2024-05-21 NOTE — Telephone Encounter (Signed)
 Spoke with   patient he has moved to [place]  .. Changed the mailing address but he also needs to have his PCP changed to  Dorn Phlegm, MD [place]  is the location not sure of the clinic information.  Thank you Darice FORBES [place] [facility] AWV TEAM Direct Dial [phone]

## 2024-06-30 ENCOUNTER — Other Ambulatory Visit: Payer: Self-pay | Admitting: Family Medicine
